# Patient Record
Sex: Female | Born: 1995 | Race: White | Hispanic: No | Marital: Single | Smoking: Never smoker
Health system: Southern US, Community
[De-identification: ages and names within clinical notes are randomized; demographics above are authoritative.]

## PROBLEM LIST (undated history)

## (undated) HISTORY — PX: BREAST REDUCTION SURGERY: SHX8

---

## 2017-09-15 ENCOUNTER — Emergency Department
Admission: EM | Admit: 2017-09-15 | Discharge: 2017-09-15 | Disposition: A | Discharge status: Home / Self Care | Payer: Managed Care, Other (non HMO) | Attending: Emergency Medicine | Admitting: Emergency Medicine

## 2017-09-15 ENCOUNTER — Encounter: Payer: Self-pay | Admitting: Emergency Medicine

## 2017-09-15 DIAGNOSIS — B9689 Other specified bacterial agents as the cause of diseases classified elsewhere: Secondary | ICD-10-CM | POA: Insufficient documentation

## 2017-09-15 DIAGNOSIS — N898 Other specified noninflammatory disorders of vagina: Secondary | ICD-10-CM | POA: Diagnosis present

## 2017-09-15 DIAGNOSIS — N760 Acute vaginitis: Secondary | ICD-10-CM | POA: Diagnosis not present

## 2017-09-15 LAB — WET PREP, GENITAL
Sperm: NONE SEEN
Trich, Wet Prep: NONE SEEN
Yeast Wet Prep HPF POC: NONE SEEN

## 2017-09-15 LAB — URINALYSIS, COMPLETE (UACMP) WITH MICROSCOPIC
BILIRUBIN URINE: NEGATIVE
GLUCOSE, UA: NEGATIVE mg/dL
HGB URINE DIPSTICK: NEGATIVE
KETONES UR: NEGATIVE mg/dL
LEUKOCYTES UA: NEGATIVE
Nitrite: NEGATIVE
PROTEIN: NEGATIVE mg/dL
Specific Gravity, Urine: 1.02 (ref 1.005–1.030)
pH: 6 (ref 5.0–8.0)

## 2017-09-15 LAB — CHLAMYDIA/NGC RT PCR (ARMC ONLY)
Chlamydia Tr: NOT DETECTED
N GONORRHOEAE: NOT DETECTED

## 2017-09-15 MED ORDER — FLUCONAZOLE 50 MG PO TABS
150.0000 mg | ORAL_TABLET | Freq: Once | ORAL | Status: AC
  Administered 2017-09-15: 150 mg via ORAL
  Filled 2017-09-15: qty 1

## 2017-09-15 MED ORDER — METRONIDAZOLE IN NACL 5-0.79 MG/ML-% IV SOLN: 500.0000 mg | Freq: Once | INTRAVENOUS | Status: DC

## 2017-09-15 MED ORDER — METRONIDAZOLE 500 MG PO TABS: 500.0000 mg | ORAL_TABLET | Freq: Two times a day (BID) | ORAL | 0 refills | Status: AC

## 2017-09-15 MED ORDER — METRONIDAZOLE 500 MG PO TABS
500.0000 mg | ORAL_TABLET | Freq: Once | ORAL | Status: AC
  Administered 2017-09-15: 500 mg via ORAL
  Filled 2017-09-15: qty 1

## 2017-09-15 NOTE — ED Notes (Signed)
Vaginal itching and burning x 3 days. States worsening each day also dysuria

## 2017-09-15 NOTE — ED Provider Notes (Signed)
[facility] Emergency Department Provider Note   ____________________________________________   First MD Initiated Contact with Patient 09/15/17 0400     (approximate)  I have reviewed the triage vital signs and the nursing notes.   HISTORY  Chief Complaint Vaginal Itching and Dysuria    HPI Beth Lucas is a 22 y.o. female who comes into the hospital today with a concern about a yeast infection.  The patient reports that she used some Monistat she got from a friend's house and after she inserted the medication she started having some burning with pain with urination.  The patient reports that the symptoms has not gone away.  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  The patient did not go to the doctor and was waiting to see if the medication help.  The patient states that she would not stop the medicine and took it out but she still having some irritation and burning.  She had some white specks on her labia and she did have some white discharge.  The patient is sexually active but reports that she uses condoms every time.  She is here for evaluation.   History reviewed. No pertinent past medical history.  There are no active problems to display for this patient.   History reviewed. No pertinent surgical history.  Prior to Admission medications   Medication Sig Start Date End Date Taking? Authorizing Provider  metroNIDAZOLE (FLAGYL) 500 MG tablet Take 1 tablet (500 mg total) by mouth 2 (two) times daily for 7 days. 09/15/17 09/22/17  Rebecka ApleyWebster, Allison P, MD    Allergies Patient has no known allergies.  History reviewed. No pertinent family history.  Social History Social History   Tobacco Use  . Smoking status: Never Smoker  . Smokeless tobacco: Never Used  Substance Use Topics  . Alcohol use: Not on file  . Drug use: Not on file    Review of Systems  Constitutional: No fever/chills Eyes: No visual changes. ENT: No sore throat. Cardiovascular: Denies chest  pain. Respiratory: Denies shortness of breath. Gastrointestinal: No abdominal pain.  No nausea, no vomiting.  No diarrhea.  No constipation. Genitourinary: Vaginal discharge and dysuria. Musculoskeletal: Negative for back pain. Skin: Negative for rash. Neurological: Negative for headaches, focal weakness or numbness.   ____________________________________________   PHYSICAL EXAM:  VITAL SIGNS: ED Triage Vitals [09/15/17 0051]  Enc Vitals Group     BP (!) 147/76     Pulse Rate 67     Resp 16     Temp 97.9 F (36.6 C)     Temp Source Oral     SpO2 100 %     Weight 125 lb (56.7 kg)     Height      Head Circumference      Peak Flow      Pain Score      Pain Loc      Pain Edu?      Excl. in GC?     Constitutional: Alert and oriented. Well appearing and in mild distress. Eyes: Conjunctivae are normal. PERRL. EOMI. Head: Atraumatic. Nose: No congestion/rhinnorhea. Mouth/Throat: Mucous membranes are moist.  Oropharynx non-erythematous. Cardiovascular: Normal rate, regular rhythm. Grossly normal heart sounds.  Good peripheral circulation. Respiratory: Normal respiratory effort.  No retractions. Lungs CTAB. Gastrointestinal: Soft and nontender. No distention. Positive bowel sounds Genitourinary: normal external genitalia with some white appearing discharge, no cervical motion tenderness and no adnexal tenderness Musculoskeletal: No lower extremity tenderness nor edema.   Neurologic:  Normal speech and language.  Skin:  Skin is warm, dry and intact.  Psychiatric: Mood and affect are normal.   ____________________________________________   LABS (all labs ordered are listed, but only abnormal results are displayed)  Labs Reviewed  WET PREP, GENITAL - Abnormal; Notable for the following components:      Result Value   Clue Cells Wet Prep HPF POC PRESENT (*)    WBC, Wet Prep HPF POC FEW (*)    All other components within normal limits  URINALYSIS, COMPLETE (UACMP) WITH  MICROSCOPIC - Abnormal; Notable for the following components:   Color, Urine YELLOW (*)    APPearance CLOUDY (*)    Bacteria, UA FEW (*)    Squamous Epithelial / LPF 6-30 (*)    All other components within normal limits  CHLAMYDIA/NGC RT PCR ([facility] ONLY)  POC URINE PREG, ED   ____________________________________________  EKG  none ____________________________________________  RADIOLOGY  No results found.  ____________________________________________   PROCEDURES  Procedure(s) performed: None  Procedures  Critical Care performed: No  ____________________________________________   INITIAL IMPRESSION / ASSESSMENT AND PLAN / ED COURSE  As part of my medical decision making, I reviewed the following data within the electronic MEDICAL RECORD NUMBER Notes from prior ED visits and [place] Controlled Substance Database   This is a 22 year old female who comes into the hospital today with some vaginal itching and discharge.  My differential diagnosis includes yeast infection, bacterial vaginosis, STI  The patient had a pelvic exam with a wet prep.  It showed some clue cells.  I gave the patient a dose of Flagyl as well as a dose of Diflucan.  The patient had recently used some Monistat which may skew the results of the wet prep.  The patient will be discharged home to follow-up with OB/GYN.      ____________________________________________   FINAL CLINICAL IMPRESSION(S) / ED DIAGNOSES  Final diagnoses:  Bacterial vaginosis  Acute vaginitis     ED Discharge Orders        Ordered    metroNIDAZOLE (FLAGYL) 500 MG tablet  2 times daily     09/15/17 0452       Note:  This document was prepared using Dragon voice recognition software and may include unintentional dictation errors.    Rebecka Apley, MD 09/15/17 0500

## 2017-09-15 NOTE — Discharge Instructions (Signed)
Please follow-up with OB/GYN for further evaluation of your vaginal irritation and pain.

## 2017-09-15 NOTE — ED Triage Notes (Signed)
Pt c/o dysuria and vaginal itching x3 days.

## 2017-09-16 LAB — URINE CULTURE: CULTURE: NO GROWTH

## 2017-09-16 LAB — POCT PREGNANCY, URINE: PREG TEST UR: NEGATIVE

## 2018-09-28 ENCOUNTER — Emergency Department
Admission: EM | Admit: 2018-09-28 | Discharge: 2018-09-28 | Disposition: A | Discharge status: Home / Self Care | Payer: Managed Care, Other (non HMO) | Attending: Emergency Medicine | Admitting: Emergency Medicine

## 2018-09-28 ENCOUNTER — Encounter: Payer: Self-pay | Admitting: Emergency Medicine

## 2018-09-28 ENCOUNTER — Other Ambulatory Visit: Payer: Self-pay

## 2018-09-28 DIAGNOSIS — J101 Influenza due to other identified influenza virus with other respiratory manifestations: Secondary | ICD-10-CM | POA: Insufficient documentation

## 2018-09-28 DIAGNOSIS — Z79899 Other long term (current) drug therapy: Secondary | ICD-10-CM | POA: Diagnosis not present

## 2018-09-28 DIAGNOSIS — R509 Fever, unspecified: Secondary | ICD-10-CM | POA: Diagnosis present

## 2018-09-28 LAB — INFLUENZA PANEL BY PCR (TYPE A & B)
Influenza A By PCR: NEGATIVE
Influenza B By PCR: POSITIVE — AB

## 2018-09-28 MED ORDER — OSELTAMIVIR PHOSPHATE 75 MG PO CAPS: 75.0000 mg | ORAL_CAPSULE | Freq: Two times a day (BID) | ORAL | 0 refills | Status: DC

## 2018-09-28 MED ORDER — ONDANSETRON 4 MG PO TBDP: 4.0000 mg | ORAL_TABLET | Freq: Three times a day (TID) | ORAL | 0 refills | Status: DC | PRN

## 2018-09-28 MED ORDER — ONDANSETRON 4 MG PO TBDP
4.0000 mg | ORAL_TABLET | Freq: Once | ORAL | Status: AC
  Administered 2018-09-28: 4 mg via ORAL
  Filled 2018-09-28: qty 1

## 2018-09-28 NOTE — Discharge Instructions (Addendum)
Follow-up with the [facility] student health services or the acute care if not better in 3 days.  Return if you feel like you are becoming dehydrated.  Use the Zofran for nausea/vomiting.  Tamiflu to decrease your flulike symptoms.

## 2018-09-28 NOTE — ED Triage Notes (Signed)
Presents with body aches cough and fever    States she had couple episodes of vomiting d/t cough   But this am also had has had some vomiting w/o cough

## 2018-09-28 NOTE — ED Provider Notes (Signed)
[facility] Emergency Department Provider Note  ____________________________________________   First MD Initiated Contact with Patient 09/28/18 [phone]     (approximate)  I have reviewed the triage vital signs and the nursing notes.   HISTORY  Chief Complaint Generalized Body Aches and Emesis    HPI Beth Lucas is a 23 y.o. female flulike symptoms, patient complained of fever, chills, body aches, cough, denies sore throat, 6 episodes of vomiting, denies diarrhea; denies chest pain or sob.  Sx for 2 days.  Patient states that her roommates have the flu.  She does not think she be pregnant as she has IUD.   History reviewed. No pertinent past medical history.  There are no active problems to display for this patient.   History reviewed. No pertinent surgical history.  Prior to Admission medications   Medication Sig Start Date End Date Taking? Authorizing Provider  buPROPion (WELLBUTRIN SR) 150 MG 12 hr tablet Take 150 mg by mouth 2 (two) times daily.   Yes [provider]  ondansetron (ZOFRAN-ODT) 4 MG disintegrating tablet Take 1 tablet (4 mg total) by mouth every 8 (eight) hours as needed. 09/28/18   Avram Danielson, Roselyn Bering, PA-C  oseltamivir (TAMIFLU) 75 MG capsule Take 1 capsule (75 mg total) by mouth 2 (two) times daily. 09/28/18   Faythe Ghee, PA-C    Allergies Patient has no known allergies.  No family history on file.  Social History Social History   Tobacco Use  . Smoking status: Never Smoker  . Smokeless tobacco: Never Used  Substance Use Topics  . Alcohol use: Not on file  . Drug use: Not on file    Review of Systems  Constitutional: Positive fever/chills Eyes: No visual changes. ENT: No sore throat. Respiratory: Positive cough Gastrointestinal: Positive for nausea/vomiting, denies diarrhea Genitourinary: Negative for dysuria. Musculoskeletal: Negative for back pain. Skin: Negative for  rash.    ____________________________________________   PHYSICAL EXAM:  VITAL SIGNS: ED Triage Vitals  Enc Vitals Group     BP 09/28/18 0830 126/65     Pulse Rate 09/28/18 0830 (!) 110     Resp 09/28/18 0830 18     Temp 09/28/18 0830 98.7 F (37.1 C)     Temp Source 09/28/18 0830 Oral     SpO2 09/28/18 0830 95 %     Weight 09/28/18 0824 125 lb (56.7 kg)     Height 09/28/18 0824 5\' 3"  (1.6 m)     Head Circumference --      Peak Flow --      Pain Score 09/28/18 0824 4     Pain Loc --      Pain Edu? --      Excl. in GC? --     Constitutional: Alert and oriented. Well appearing and in no acute distress. Eyes: Conjunctivae are normal.  Head: Atraumatic. Nose: No congestion/rhinnorhea. Mouth/Throat: Mucous membranes are moist.   Neck:  supple no lymphadenopathy noted Cardiovascular: Normal rate, regular rhythm. Heart sounds are normal Respiratory: Normal respiratory effort.  No retractions, lungs c t a  Abd: soft mildly tender in all 4 quads, no McBurney's point tenderness, negative Murphy sign, Bs normal all 4 quad GU: deferred Musculoskeletal: FROM all extremities, warm and well perfused Neurologic:  Normal speech and language.  Skin:  Skin is warm, dry and intact. No rash noted. Psychiatric: Mood and affect are normal. Speech and behavior are normal.  ____________________________________________   LABS (all labs ordered are listed, but only  abnormal results are displayed)  Labs Reviewed  INFLUENZA PANEL BY PCR (TYPE A & B) - Abnormal; Notable for the following components:      Result Value   Influenza B By PCR POSITIVE (*)    All other components within normal limits   ____________________________________________   ____________________________________________  RADIOLOGY    ____________________________________________   PROCEDURES  Procedure(s) performed: No  Procedures    ____________________________________________   INITIAL IMPRESSION /  ASSESSMENT AND PLAN / ED COURSE  Pertinent labs & imaging results that were available during my care of the patient were reviewed by me and considered in my medical decision making (see chart for details).   Patient is 23 year old female presents emergency department complaint of flulike symptoms.  Patient states her roommates have had the flu.  Physical exam patient appears nontoxic.  She does have a dry hacking cough.  Due to her nausea Zofran ODT was given to her. Flu test is positive for influenza B.  Discussed the results with patient.  She is to follow-up with [facility] student health services or return emergency department if worsening.  Take Tylenol/ibuprofen for fever.  Drink plenty of fluids.  She was also given prescription for Tamiflu and Zofran.  She states she understands and will comply.  She is given a school note.  She is discharged in stable condition.     As part of my medical decision making, I reviewed the following data within the electronic MEDICAL RECORD NUMBER Nursing notes reviewed and incorporated, Labs reviewed flu test is positive, Old chart reviewed, Notes from prior ED visits and [place] Controlled Substance Database  ____________________________________________   FINAL CLINICAL IMPRESSION(S) / ED DIAGNOSES  Final diagnoses:  Influenza B      NEW MEDICATIONS STARTED DURING THIS VISIT:  Discharge Medication List as of 09/28/2018  9:42 AM    START taking these medications   Details  ondansetron (ZOFRAN-ODT) 4 MG disintegrating tablet Take 1 tablet (4 mg total) by mouth every 8 (eight) hours as needed., Starting Tue 09/28/2018, Normal    oseltamivir (TAMIFLU) 75 MG capsule Take 1 capsule (75 mg total) by mouth 2 (two) times daily., Starting Tue 09/28/2018, Normal         Note:  This document was prepared using Dragon voice recognition software and may include unintentional dictation errors.    Faythe Ghee, PA-C 09/28/18 1317    Rockne Menghini,  MD 09/28/18 1524

## 2019-05-25 ENCOUNTER — Other Ambulatory Visit: Payer: Self-pay | Admitting: *Deleted

## 2019-05-25 DIAGNOSIS — Z20822 Contact with and (suspected) exposure to covid-19: Secondary | ICD-10-CM

## 2019-05-26 LAB — NOVEL CORONAVIRUS, NAA: SARS-CoV-2, NAA: NOT DETECTED

## 2019-06-19 DIAGNOSIS — F1210 Cannabis abuse, uncomplicated: Secondary | ICD-10-CM | POA: Insufficient documentation

## 2019-06-19 DIAGNOSIS — Z79899 Other long term (current) drug therapy: Secondary | ICD-10-CM | POA: Diagnosis not present

## 2019-06-19 DIAGNOSIS — F419 Anxiety disorder, unspecified: Secondary | ICD-10-CM | POA: Diagnosis present

## 2019-06-19 DIAGNOSIS — R1110 Vomiting, unspecified: Secondary | ICD-10-CM | POA: Diagnosis not present

## 2019-06-19 DIAGNOSIS — F410 Panic disorder [episodic paroxysmal anxiety] without agoraphobia: Secondary | ICD-10-CM | POA: Insufficient documentation

## 2019-06-20 ENCOUNTER — Other Ambulatory Visit: Payer: Self-pay

## 2019-06-20 ENCOUNTER — Emergency Department
Admission: EM | Admit: 2019-06-20 | Discharge: 2019-06-20 | Disposition: A | Discharge status: Home / Self Care | Payer: Managed Care, Other (non HMO) | Attending: Emergency Medicine | Admitting: Emergency Medicine

## 2019-06-20 ENCOUNTER — Encounter: Payer: Self-pay | Admitting: Emergency Medicine

## 2019-06-20 DIAGNOSIS — F419 Anxiety disorder, unspecified: Secondary | ICD-10-CM

## 2019-06-20 DIAGNOSIS — F410 Panic disorder [episodic paroxysmal anxiety] without agoraphobia: Secondary | ICD-10-CM

## 2019-06-20 DIAGNOSIS — Z20822 Contact with and (suspected) exposure to covid-19: Secondary | ICD-10-CM

## 2019-06-20 LAB — URINALYSIS, COMPLETE (UACMP) WITH MICROSCOPIC
Bilirubin Urine: NEGATIVE
Glucose, UA: NEGATIVE mg/dL
Hgb urine dipstick: NEGATIVE
Ketones, ur: 5 mg/dL — AB
Nitrite: NEGATIVE
Protein, ur: 30 mg/dL — AB
Specific Gravity, Urine: 1.026 (ref 1.005–1.030)
pH: 6 (ref 5.0–8.0)

## 2019-06-20 LAB — COMPREHENSIVE METABOLIC PANEL
ALT: 25 U/L (ref 0–44)
AST: 28 U/L (ref 15–41)
Albumin: 5.1 g/dL — ABNORMAL HIGH (ref 3.5–5.0)
Alkaline Phosphatase: 33 U/L — ABNORMAL LOW (ref 38–126)
Anion gap: 10 (ref 5–15)
BUN: 14 mg/dL (ref 6–20)
CO2: 27 mmol/L (ref 22–32)
Calcium: 10 mg/dL (ref 8.9–10.3)
Chloride: 102 mmol/L (ref 98–111)
Creatinine, Ser: 0.79 mg/dL (ref 0.44–1.00)
GFR calc Af Amer: >60 mL/min (ref >60)
GFR calc non Af Amer: >60 mL/min (ref >60)
Glucose, Bld: 112 mg/dL — ABNORMAL HIGH (ref 70–99)
Potassium: 3.8 mmol/L (ref 3.5–5.1)
Sodium: 139 mmol/L (ref 135–145)
Total Bilirubin: 1 mg/dL (ref 0.3–1.2)
Total Protein: 8.5 g/dL — ABNORMAL HIGH (ref 6.5–8.1)

## 2019-06-20 LAB — CBC
HCT: 39.7 % (ref 36.0–46.0)
Hemoglobin: 13.5 g/dL (ref 12.0–15.0)
MCH: 30.1 pg (ref 26.0–34.0)
MCHC: 34 g/dL (ref 30.0–36.0)
MCV: 88.6 fL (ref 80.0–100.0)
Platelets: 239 10*3/uL (ref 150–400)
RBC: 4.48 MIL/uL (ref 3.87–5.11)
RDW: 11.2 % — ABNORMAL LOW (ref 11.5–15.5)
WBC: 10.3 10*3/uL (ref 4.0–10.5)
nRBC: 0 % (ref 0.0–0.2)

## 2019-06-20 LAB — POCT PREGNANCY, URINE: Preg Test, Ur: NEGATIVE

## 2019-06-20 LAB — LIPASE, BLOOD: Lipase: 38 U/L (ref 11–51)

## 2019-06-20 MED ORDER — ONDANSETRON 4 MG PO TBDP
4.0000 mg | ORAL_TABLET | Freq: Once | ORAL | Status: AC
  Administered 2019-06-20: 4 mg via ORAL
  Filled 2019-06-20: qty 1

## 2019-06-20 MED ORDER — ALUM & MAG HYDROXIDE-SIMETH 200-200-20 MG/5ML PO SUSP
30.0000 mL | Freq: Once | ORAL | Status: AC
  Administered 2019-06-20: 30 mL via ORAL
  Filled 2019-06-20: qty 30

## 2019-06-20 MED ORDER — LIDOCAINE VISCOUS HCL 2 % MT SOLN
15.0000 mL | Freq: Once | OROMUCOSAL | Status: AC
  Administered 2019-06-20: 15 mL via ORAL
  Filled 2019-06-20: qty 15

## 2019-06-20 MED ORDER — HYDROXYZINE HCL 25 MG PO TABS
25.0000 mg | ORAL_TABLET | Freq: Once | ORAL | Status: AC
  Administered 2019-06-20: 25 mg via ORAL
  Filled 2019-06-20: qty 1

## 2019-06-20 NOTE — ED Triage Notes (Signed)
Pt arrives POV and ambulatory to triage with c/o anxiety all day. Pt states that she started having episodes of emesis. Pt appears in NAD.

## 2019-06-20 NOTE — ED Notes (Signed)
Pt given ginger ale to drink for PO challenge per MD
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
## 2019-06-21 LAB — NOVEL CORONAVIRUS, NAA: SARS-CoV-2, NAA: NOT DETECTED

## 2019-06-22 ENCOUNTER — Telehealth: Payer: Self-pay

## 2019-06-22 NOTE — Telephone Encounter (Signed)
Patient returned call for [place] lab results - DOB/Address verified - Negative results given, no further questions.

## 2019-12-15 ENCOUNTER — Encounter: Payer: Self-pay | Admitting: Urology

## 2020-11-06 ENCOUNTER — Emergency Department
Admission: EM | Admit: 2020-11-06 | Discharge: 2020-11-06 | Disposition: A | Discharge status: Home / Self Care | Payer: Managed Care, Other (non HMO) | Attending: Emergency Medicine | Admitting: Emergency Medicine

## 2020-11-06 ENCOUNTER — Other Ambulatory Visit: Payer: Self-pay

## 2020-11-06 ENCOUNTER — Ambulatory Visit: Payer: Self-pay

## 2020-11-06 ENCOUNTER — Emergency Department: Payer: Managed Care, Other (non HMO)

## 2020-11-06 ENCOUNTER — Telehealth: Payer: Self-pay

## 2020-11-06 DIAGNOSIS — S060X1A Concussion with loss of consciousness of 30 minutes or less, initial encounter: Secondary | ICD-10-CM | POA: Insufficient documentation

## 2020-11-06 DIAGNOSIS — H538 Other visual disturbances: Secondary | ICD-10-CM | POA: Diagnosis not present

## 2020-11-06 DIAGNOSIS — S0990XA Unspecified injury of head, initial encounter: Secondary | ICD-10-CM | POA: Diagnosis present

## 2020-11-06 DIAGNOSIS — Y9302 Activity, running: Secondary | ICD-10-CM | POA: Diagnosis not present

## 2020-11-06 DIAGNOSIS — W0110XA Fall on same level from slipping, tripping and stumbling with subsequent striking against unspecified object, initial encounter: Secondary | ICD-10-CM | POA: Insufficient documentation

## 2020-11-06 DIAGNOSIS — W19XXXA Unspecified fall, initial encounter: Secondary | ICD-10-CM

## 2020-11-06 MED ORDER — KETOROLAC TROMETHAMINE 30 MG/ML IJ SOLN
30.0000 mg | Freq: Once | INTRAMUSCULAR | Status: AC
  Administered 2020-11-06: 30 mg via INTRAMUSCULAR
  Filled 2020-11-06: qty 1

## 2020-11-06 MED ORDER — MUPIROCIN CALCIUM 2 % EX CREA: 1.0000 "application " | TOPICAL_CREAM | Freq: Two times a day (BID) | CUTANEOUS | 0 refills | Status: AC
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
## 2020-11-06 NOTE — ED Provider Notes (Signed)
[facility]-EMERGENCY DEPARTMENT  ____________________________________________  Time seen: Approximately 3:50 PM  I have reviewed the triage vital signs and the nursing notes.   HISTORY  Chief Complaint Fall   Historian Patient     HPI Beth Lucas is a 25 y.o. female presents to the emergency department after patient had a mechanical fall on Friday.  Patient states that she was running and tripped.  She states that she hit her head, face first.  She states that her vision blacked out for a few seconds but she could hear people talking when injury occurred.  She denies neck pain.  No numbness or tingling in the upper and lower extremities.  She denies chest pain, chest tightness and abdominal pain.  She states that since injury occurred, she has been sleeping most of the time and has blurry vision out of her right eye and dizziness when she stands up.  No prior history of traumatic brain injury.  Patient states that she did not seek care immediately as she thought that she would just ice it and monitor symptoms at home.    No past medical history on file.   Immunizations up to date:  Yes.     No past medical history on file.  There are no problems to display for this patient.   No past surgical history on file.  Prior to Admission medications   Medication Sig Start Date End Date Taking? Authorizing Provider  escitalopram (LEXAPRO) 10 MG tablet Take 10 mg by mouth daily.   Yes [provider]  lisdexamfetamine (VYVANSE) 20 MG capsule Take 20 mg by mouth daily.   Yes [provider]  mupirocin cream (BACTROBAN) 2 % Apply 1 application topically 2 (two) times daily for 7 days. 11/06/20 11/13/20 Yes Orvil Feil, PA-C    Allergies Patient has no known allergies.  No family history on file.  Social History Social History   Tobacco Use  . Smoking status: Never Smoker  . Smokeless tobacco: Never Used  Substance Use Topics  . Alcohol use: Yes  . Drug use:  Yes    Types: Marijuana     Review of Systems  Constitutional: No fever/chills Eyes:  No discharge ENT: Patient has facial pain.  Respiratory: no cough. No SOB/ use of accessory muscles to breath Gastrointestinal:   No nausea, no vomiting.  No diarrhea.  No constipation. Musculoskeletal: Negative for musculoskeletal pain. Skin: Negative for rash, abrasions, lacerations, ecchymosis.   ____________________________________________   PHYSICAL EXAM:  VITAL SIGNS: ED Triage Vitals  Enc Vitals Group     BP 11/06/20 1514 125/78     Pulse Rate 11/06/20 1514 70     Resp 11/06/20 1514 15     Temp 11/06/20 1514 98.3 F (36.8 C)     Temp src --      SpO2 11/06/20 1514 100 %     Weight 11/06/20 1515 145 lb (65.8 kg)     Height 11/06/20 1515 5\' 4"  (1.626 m)     Head Circumference --      Peak Flow --      Pain Score 11/06/20 1514 6     Pain Loc --      Pain Edu? --      Excl. in GC? --      Constitutional: Alert and oriented. Well appearing and in no acute distress. Eyes: Conjunctivae are normal. PERRL. EOMI. Head: Atraumatic. Patient has facial abrasions.  ENT:      Ears: TMs are pearly.  Nose: No congestion/rhinnorhea.      Mouth/Throat: Mucous membranes are moist.  Neck: No stridor.  No cervical spine tenderness to palpation. Cardiovascular: Normal rate, regular rhythm. Normal S1 and S2.  Good peripheral circulation. Respiratory: Normal respiratory effort without tachypnea or retractions. Lungs CTAB. Good air entry to the bases with no decreased or absent breath sounds Gastrointestinal: Bowel sounds x 4 quadrants. Soft and nontender to palpation. No guarding or rigidity. No distention. Musculoskeletal: Full range of motion to all extremities. No obvious deformities noted.  Neurologic:  Normal for age. No gross focal neurologic deficits are appreciated. Cranial nerves 2-12 are intact.  Patient can perform rapid alternating movements. Skin:  Skin is warm, dry and intact.  No rash noted. Psychiatric: Mood and affect are normal for age. Speech and behavior are normal.   ____________________________________________   LABS (all labs ordered are listed, but only abnormal results are displayed)  Labs Reviewed - No data to display ____________________________________________  EKG   ____________________________________________  RADIOLOGY Geraldo Pitter, personally viewed and evaluated these images (plain radiographs) as part of my medical decision making, as well as reviewing the written report by the radiologist.    CT Head Wo Contrast  Result Date: 11/06/2020 CLINICAL DATA:  Facial trauma, fell 5 days ago while running, struck her head, vision 1 black, facial pain, blurred vision mostly on RIGHT little little blood on the LEFT as well EXAM: CT HEAD WITHOUT CONTRAST CT MAXILLOFACIAL WITHOUT CONTRAST TECHNIQUE: Multidetector CT imaging of the head and maxillofacial structures were performed using the standard protocol without intravenous contrast. Multiplanar CT image reconstructions of the maxillofacial structures were also generated. COMPARISON:  None FINDINGS: CT HEAD FINDINGS Brain: Normal ventricular morphology. No midline shift or mass effect. Normal appearance of brain parenchyma. No intracranial hemorrhage, mass lesion, evidence of acute infarction, or extra-axial fluid collection. Vascular: No hyperdense vessels Skull: Intact Other: N/A CT MAXILLOFACIAL FINDINGS Osseous: Incomplete posterior arch C1, developmental anomaly. Nasal septum midline. TMJ alignment normal. Facial bones intact. Orbits: Intraorbital soft tissue planes clear without fluid or gas. Bony orbits intact Sinuses: Mucosal thickening in maxillary sinuses bilaterally. Remaining paranasal sinuses, mastoid air cells and middle ear cavities clear Soft tissues: No significant soft tissue abnormalities. IMPRESSION: Normal CT head. No acute facial bone abnormalities. Nonspecific mucosal thickening  in the maxillary sinuses bilaterally. Electronically Signed   By: Ulyses Southward M.D.   On: 11/06/2020 16:26   CT Maxillofacial Wo Contrast  Result Date: 11/06/2020 CLINICAL DATA:  Facial trauma, fell 5 days ago while running, struck her head, vision 1 black, facial pain, blurred vision mostly on RIGHT little little blood on the LEFT as well EXAM: CT HEAD WITHOUT CONTRAST CT MAXILLOFACIAL WITHOUT CONTRAST TECHNIQUE: Multidetector CT imaging of the head and maxillofacial structures were performed using the standard protocol without intravenous contrast. Multiplanar CT image reconstructions of the maxillofacial structures were also generated. COMPARISON:  None FINDINGS: CT HEAD FINDINGS Brain: Normal ventricular morphology. No midline shift or mass effect. Normal appearance of brain parenchyma. No intracranial hemorrhage, mass lesion, evidence of acute infarction, or extra-axial fluid collection. Vascular: No hyperdense vessels Skull: Intact Other: N/A CT MAXILLOFACIAL FINDINGS Osseous: Incomplete posterior arch C1, developmental anomaly. Nasal septum midline. TMJ alignment normal. Facial bones intact. Orbits: Intraorbital soft tissue planes clear without fluid or gas. Bony orbits intact Sinuses: Mucosal thickening in maxillary sinuses bilaterally. Remaining paranasal sinuses, mastoid air cells and middle ear cavities clear Soft tissues: No significant soft tissue abnormalities. IMPRESSION: Normal CT head.  No acute facial bone abnormalities. Nonspecific mucosal thickening in the maxillary sinuses bilaterally. Electronically Signed   By: Ulyses Southward M.D.   On: 11/06/2020 16:26    ____________________________________________    PROCEDURES  Procedure(s) performed:     Procedures     Medications  ketorolac (TORADOL) 30 MG/ML injection 30 mg (30 mg Intramuscular Given 11/06/20 1640)     ____________________________________________   INITIAL IMPRESSION / ASSESSMENT AND PLAN / ED COURSE  Pertinent  labs & imaging results that were available during my care of the patient were reviewed by me and considered in my medical decision making (see chart for details).       Assessment and Plan:  Fall Concussion 25 year old female presents to the emergency department after she had a mechanical fall while running on Friday.  Vital signs were reassuring at triage.  On physical exam, patient was alert, active and nontoxic-appearing.  She had no neuro deficits noted on exam.  CT head and CT max face showed no evidence of intracranial bleed or maxillofacial fracture.  Patient was given Toradol in the emergency department for headache.  She was discharged with topical mupirocin and advised to continue taking Tylenol and ibuprofen alternating for headache.  Rest and hydration were encouraged at home.  All patient questions were answered.    ____________________________________________  FINAL CLINICAL IMPRESSION(S) / ED DIAGNOSES  Final diagnoses:  Fall, initial encounter  Concussion with loss of consciousness of 30 minutes or less, initial encounter      NEW MEDICATIONS STARTED DURING THIS VISIT:  ED Discharge Orders         Ordered    mupirocin cream (BACTROBAN) 2 %  2 times daily        11/06/20 1637              This chart was dictated using voice recognition software/Dragon. Despite best efforts to proofread, errors can occur which can change the meaning. Any change was purely unintentional.     Orvil Feil, PA-C 11/06/20 1643    Concha Se, MD 11/06/20 [phone]

## 2020-11-06 NOTE — Discharge Instructions (Addendum)
Reduce screen time over the next several days. Apply Mupirocin twice daily to face for the next seven days.  Continue to take Tylenol and Ibuprofen alternating for pain.  You have been given a referral to Neurology. Please use referral if your concussion symptoms do not seem to be improving.

## 2020-11-06 NOTE — ED Notes (Signed)
See triage note  Present s/p fall   States she tripped and fell face first  Abrasion noted to left knee,right hand and right cheek area

## 2020-11-06 NOTE — Telephone Encounter (Signed)
Called pt to inquire about her appt c/c of "head injury". Pt states she tripped and fell over the weekend and struck her "face on the pavement". Pt reports brief LOC at time of fall and that she was evaluated at "[facility]" yesterday and was advised to have additional imaging of face/head for concern of possible orbital fracture/ eye are injury. Explained to pt that we [facility]) does not have capability for extensive facial imaging (Panorex or CT of head) and advised pt to go to ED ASAP. Pt verbalized understanding.

## 2021-05-23 ENCOUNTER — Other Ambulatory Visit: Payer: Self-pay

## 2021-05-23 ENCOUNTER — Ambulatory Visit: Payer: Self-pay

## 2021-05-23 DIAGNOSIS — R300 Dysuria: Secondary | ICD-10-CM | POA: Insufficient documentation

## 2021-05-23 DIAGNOSIS — M545 Low back pain, unspecified: Secondary | ICD-10-CM | POA: Diagnosis not present

## 2021-05-23 DIAGNOSIS — N83291 Other ovarian cyst, right side: Secondary | ICD-10-CM | POA: Insufficient documentation

## 2021-05-23 DIAGNOSIS — R197 Diarrhea, unspecified: Secondary | ICD-10-CM | POA: Diagnosis not present

## 2021-05-23 DIAGNOSIS — N83292 Other ovarian cyst, left side: Secondary | ICD-10-CM | POA: Insufficient documentation

## 2021-05-23 DIAGNOSIS — Z5321 Procedure and treatment not carried out due to patient leaving prior to being seen by health care provider: Secondary | ICD-10-CM | POA: Insufficient documentation

## 2021-05-23 DIAGNOSIS — R1030 Lower abdominal pain, unspecified: Secondary | ICD-10-CM | POA: Diagnosis present

## 2021-05-23 LAB — URINALYSIS, COMPLETE (UACMP) WITH MICROSCOPIC
Bilirubin Urine: NEGATIVE
Glucose, UA: NEGATIVE mg/dL
Hgb urine dipstick: NEGATIVE
Ketones, ur: NEGATIVE mg/dL
Leukocytes,Ua: NEGATIVE
Nitrite: NEGATIVE
Protein, ur: NEGATIVE mg/dL
Specific Gravity, Urine: 1.005 (ref 1.005–1.030)
pH: 6 (ref 5.0–8.0)

## 2021-05-23 LAB — CBC
HCT: 35.8 % — ABNORMAL LOW (ref 36.0–46.0)
Hemoglobin: 12.5 g/dL (ref 12.0–15.0)
MCH: 30.6 pg (ref 26.0–34.0)
MCHC: 34.9 g/dL (ref 30.0–36.0)
MCV: 87.7 fL (ref 80.0–100.0)
Platelets: 199 10*3/uL (ref 150–400)
RBC: 4.08 MIL/uL (ref 3.87–5.11)
RDW: 11.6 % (ref 11.5–15.5)
WBC: 13.7 10*3/uL — ABNORMAL HIGH (ref 4.0–10.5)
nRBC: 0 % (ref 0.0–0.2)

## 2021-05-23 LAB — COMPREHENSIVE METABOLIC PANEL
ALT: 26 U/L (ref 0–44)
AST: 25 U/L (ref 15–41)
Albumin: 4.8 g/dL (ref 3.5–5.0)
Alkaline Phosphatase: 26 U/L — ABNORMAL LOW (ref 38–126)
Anion gap: 8 (ref 5–15)
BUN: 10 mg/dL (ref 6–20)
CO2: 26 mmol/L (ref 22–32)
Calcium: 9.2 mg/dL (ref 8.9–10.3)
Chloride: 100 mmol/L (ref 98–111)
Creatinine, Ser: 0.63 mg/dL (ref 0.44–1.00)
GFR, Estimated: >60 mL/min (ref >60)
Glucose, Bld: 96 mg/dL (ref 70–99)
Potassium: 3.6 mmol/L (ref 3.5–5.1)
Sodium: 134 mmol/L — ABNORMAL LOW (ref 135–145)
Total Bilirubin: 0.7 mg/dL (ref 0.3–1.2)
Total Protein: 7.6 g/dL (ref 6.5–8.1)

## 2021-05-23 LAB — POC URINE PREG, ED: Preg Test, Ur: NEGATIVE

## 2021-05-23 NOTE — ED Triage Notes (Signed)
Pt in with co mid lower abd pain that radiates to lower back. States has had diarrhea today, no vomiting, does have dysuria.

## 2021-05-24 ENCOUNTER — Encounter: Payer: Self-pay | Admitting: Emergency Medicine

## 2021-05-24 ENCOUNTER — Emergency Department
Admission: EM | Admit: 2021-05-24 | Discharge: 2021-05-24 | Disposition: A | Discharge status: Home / Self Care | Payer: Managed Care, Other (non HMO) | Source: Home / Self Care | Attending: Emergency Medicine | Admitting: Emergency Medicine

## 2021-05-24 ENCOUNTER — Emergency Department: Payer: Managed Care, Other (non HMO)

## 2021-05-24 ENCOUNTER — Emergency Department
Admission: EM | Admit: 2021-05-24 | Discharge: 2021-05-24 | Disposition: A | Discharge status: Home / Self Care | Payer: Managed Care, Other (non HMO) | Attending: Emergency Medicine | Admitting: Emergency Medicine

## 2021-05-24 DIAGNOSIS — N83291 Other ovarian cyst, right side: Secondary | ICD-10-CM | POA: Insufficient documentation

## 2021-05-24 DIAGNOSIS — N83292 Other ovarian cyst, left side: Secondary | ICD-10-CM | POA: Insufficient documentation

## 2021-05-24 DIAGNOSIS — N83209 Unspecified ovarian cyst, unspecified side: Secondary | ICD-10-CM

## 2021-05-24 DIAGNOSIS — R102 Pelvic and perineal pain: Secondary | ICD-10-CM

## 2021-05-24 LAB — COMPREHENSIVE METABOLIC PANEL
ALT: 24 U/L (ref 0–44)
AST: 23 U/L (ref 15–41)
Albumin: 4.5 g/dL (ref 3.5–5.0)
Alkaline Phosphatase: 26 U/L — ABNORMAL LOW (ref 38–126)
Anion gap: 7 (ref 5–15)
BUN: 12 mg/dL (ref 6–20)
CO2: 26 mmol/L (ref 22–32)
Calcium: 9.2 mg/dL (ref 8.9–10.3)
Chloride: 102 mmol/L (ref 98–111)
Creatinine, Ser: 0.6 mg/dL (ref 0.44–1.00)
GFR, Estimated: >60 mL/min (ref >60)
Glucose, Bld: 99 mg/dL (ref 70–99)
Potassium: 4 mmol/L (ref 3.5–5.1)
Sodium: 135 mmol/L (ref 135–145)
Total Bilirubin: 1 mg/dL (ref 0.3–1.2)
Total Protein: 7.3 g/dL (ref 6.5–8.1)

## 2021-05-24 LAB — URINALYSIS, COMPLETE (UACMP) WITH MICROSCOPIC
Bilirubin Urine: NEGATIVE
Glucose, UA: NEGATIVE mg/dL
Hgb urine dipstick: NEGATIVE
Ketones, ur: 20 mg/dL — AB
Leukocytes,Ua: NEGATIVE
Nitrite: NEGATIVE
Protein, ur: NEGATIVE mg/dL
Specific Gravity, Urine: 1.013 (ref 1.005–1.030)
pH: 6 (ref 5.0–8.0)

## 2021-05-24 LAB — CBC
HCT: 36.7 % (ref 36.0–46.0)
Hemoglobin: 12.6 g/dL (ref 12.0–15.0)
MCH: 30.4 pg (ref 26.0–34.0)
MCHC: 34.3 g/dL (ref 30.0–36.0)
MCV: 88.6 fL (ref 80.0–100.0)
Platelets: 208 10*3/uL (ref 150–400)
RBC: 4.14 MIL/uL (ref 3.87–5.11)
RDW: 11.6 % (ref 11.5–15.5)
WBC: 7.6 10*3/uL (ref 4.0–10.5)
nRBC: 0 % (ref 0.0–0.2)

## 2021-05-24 LAB — LIPASE, BLOOD: Lipase: 23 U/L (ref 11–51)

## 2021-05-24 LAB — HEMOGLOBIN AND HEMATOCRIT, BLOOD
HCT: 33.2 % — ABNORMAL LOW (ref 36.0–46.0)
Hemoglobin: 11.4 g/dL — ABNORMAL LOW (ref 12.0–15.0)

## 2021-05-24 LAB — POC URINE PREG, ED: Preg Test, Ur: NEGATIVE

## 2021-05-24 MED ORDER — ONDANSETRON HCL 4 MG/2ML IJ SOLN
4.0000 mg | Freq: Once | INTRAMUSCULAR | Status: AC
  Administered 2021-05-24: 4 mg via INTRAVENOUS
  Filled 2021-05-24: qty 2

## 2021-05-24 MED ORDER — IBUPROFEN 600 MG PO TABS: 600.0000 mg | ORAL_TABLET | Freq: Three times a day (TID) | ORAL | 0 refills | Status: DC | PRN

## 2021-05-24 MED ORDER — IOHEXOL 350 MG/ML SOLN
80.0000 mL | Freq: Once | INTRAVENOUS | Status: AC | PRN
  Administered 2021-05-24: 80 mL via INTRAVENOUS
  Filled 2021-05-24: qty 80

## 2021-05-24 MED ORDER — KETOROLAC TROMETHAMINE 30 MG/ML IJ SOLN
15.0000 mg | Freq: Once | INTRAMUSCULAR | Status: AC
  Administered 2021-05-24: 15 mg via INTRAVENOUS
  Filled 2021-05-24: qty 1

## 2021-05-24 MED ORDER — ONDANSETRON 4 MG PO TBDP: 4.0000 mg | ORAL_TABLET | Freq: Three times a day (TID) | ORAL | 0 refills | Status: AC | PRN

## 2021-05-24 MED ORDER — OXYCODONE-ACETAMINOPHEN 5-325 MG PO TABS
2.0000 | ORAL_TABLET | Freq: Once | ORAL | Status: AC
  Administered 2021-05-24: 2 via ORAL
  Filled 2021-05-24: qty 2

## 2021-05-24 MED ORDER — MORPHINE SULFATE (PF) 4 MG/ML IV SOLN
4.0000 mg | Freq: Once | INTRAVENOUS | Status: AC
  Administered 2021-05-24: 4 mg via INTRAVENOUS
  Filled 2021-05-24: qty 1

## 2021-05-24 MED ORDER — ONDANSETRON 4 MG PO TBDP: 4.0000 mg | ORAL_TABLET | Freq: Three times a day (TID) | ORAL | 0 refills | Status: DC | PRN

## 2021-05-24 MED ORDER — OXYCODONE HCL 5 MG PO TABS: 5.0000 mg | ORAL_TABLET | Freq: Four times a day (QID) | ORAL | 0 refills | Status: AC | PRN

## 2021-05-24 MED ORDER — HYDROCODONE-ACETAMINOPHEN 5-325 MG PO TABS: 1.0000 | ORAL_TABLET | Freq: Four times a day (QID) | ORAL | 0 refills | Status: DC | PRN

## 2021-05-24 MED ORDER — SODIUM CHLORIDE 0.9 % IV BOLUS
1000.0000 mL | Freq: Once | INTRAVENOUS | Status: AC
  Administered 2021-05-24: 1000 mL via INTRAVENOUS

## 2021-05-24 NOTE — ED Provider Notes (Addendum)
[facility] Emergency Department Provider Note  ____________________________________________   Event Date/Time   First MD Initiated Contact with Patient 05/24/21 1222     (approximate)  I have reviewed the triage vital signs and the nursing notes.   HISTORY  Chief Complaint Abdominal Pain    HPI Beth Lucas is a 25 y.o. female here with lower abdominal pain.  The patient states that her pain began fairly acutely yesterday with initially mild and progressively worsening aching, stabbing, lower abdominal pain.  The pain is localized primarily in the lower abdomen, but more so on the right lower abdomen.  The pain initially was associated with the sensation like she had to have a bowel movement, and she had loose bowel movement followed by persistent urge to have a bowel movement.  She also states that she has had some aching, cramping, deep pain after urinating.  She denies overt dysuria during the actual urination as well.  She has a history of UTIs but states this feels somewhat different.  The pain is worse with standing straight up.  No alleviating factors.  No fevers.  She said some chills.  She said nausea and decreased appetite.  No actual vomiting.  Denies any vaginal bleeding or discharge.  No other sexual activity.  No dyspareunia.    History reviewed. No pertinent past medical history.  There are no problems to display for this patient.   History reviewed. No pertinent surgical history.  Prior to Admission medications   Medication Sig Start Date End Date Taking? Authorizing Provider  ondansetron (ZOFRAN ODT) 4 MG disintegrating tablet Take 1 tablet (4 mg total) by mouth every 8 (eight) hours as needed for nausea or vomiting. 05/24/21  Yes Shaune Pollack, MD  oxyCODONE (ROXICODONE) 5 MG immediate release tablet Take 1-2 tablets (5-10 mg total) by mouth every 6 (six) hours as needed for moderate pain or severe pain. 05/24/21 05/24/22 Yes Shaune Pollack, MD  escitalopram (LEXAPRO) 10 MG tablet Take 10 mg by mouth daily.    [provider]  lisdexamfetamine (VYVANSE) 20 MG capsule Take 20 mg by mouth daily.    [provider]    Allergies Patient has no known allergies.  No family history on file.  Social History Social History   Tobacco Use   Smoking status: Never   Smokeless tobacco: Never  Substance Use Topics   Alcohol use: Yes   Drug use: Yes    Types: Marijuana    Review of Systems  Review of Systems  Constitutional:  Positive for fatigue. Negative for fever.  HENT:  Negative for congestion and sore throat.   Eyes:  Negative for visual disturbance.  Respiratory:  Negative for cough and shortness of breath.   Cardiovascular:  Negative for chest pain.  Gastrointestinal:  Positive for abdominal pain and nausea. Negative for diarrhea and vomiting.  Genitourinary:  Negative for flank pain.  Musculoskeletal:  Negative for back pain and neck pain.  Skin:  Negative for rash and wound.  Neurological:  Negative for weakness.  All other systems reviewed and are negative.   ____________________________________________  PHYSICAL EXAM:      VITAL SIGNS: ED Triage Vitals  Enc Vitals Group     BP 05/24/21 1201 129/84     Pulse Rate 05/24/21 1201 92     Resp 05/24/21 1201 16     Temp 05/24/21 1201 98.1 F (36.7 C)     Temp Source 05/24/21 1201 Oral  SpO2 05/24/21 1201 98 %     Weight 05/24/21 1156 135 lb (61.2 kg)     Height 05/24/21 1156 5\' 4"  (1.626 m)     Head Circumference --      Peak Flow --      Pain Score 05/24/21 1156 6     Pain Loc --      Pain Edu? --      Excl. in GC? --      Physical Exam Vitals and nursing note reviewed.  Constitutional:      General: She is not in acute distress.    Appearance: She is well-developed.  HENT:     Head: Normocephalic and atraumatic.  Eyes:     Conjunctiva/sclera: Conjunctivae normal.  Cardiovascular:     Rate and Rhythm: Normal rate  and regular rhythm.     Heart sounds: Normal heart sounds. No murmur heard.   No friction rub.  Pulmonary:     Effort: Pulmonary effort is normal. No respiratory distress.     Breath sounds: Normal breath sounds. No wheezing or rales.  Abdominal:     General: There is no distension.     Palpations: Abdomen is soft.     Tenderness: There is abdominal tenderness in the right lower quadrant and suprapubic area. There is guarding. There is no right CVA tenderness, left CVA tenderness or rebound.  Musculoskeletal:     Cervical back: Neck supple.  Skin:    General: Skin is warm.     Capillary Refill: Capillary refill takes less than 2 seconds.  Neurological:     Mental Status: She is alert and oriented to person, place, and time.     Motor: No abnormal muscle tone.      ____________________________________________   LABS (all labs ordered are listed, but only abnormal results are displayed)  Labs Reviewed  COMPREHENSIVE METABOLIC PANEL - Abnormal; Notable for the following components:      Result Value   Alkaline Phosphatase 26 (*)    All other components within normal limits  URINALYSIS, COMPLETE (UACMP) WITH MICROSCOPIC - Abnormal; Notable for the following components:   Color, Urine YELLOW (*)    APPearance HAZY (*)    Ketones, ur 20 (*)    Bacteria, UA MANY (*)    All other components within normal limits  HEMOGLOBIN AND HEMATOCRIT, BLOOD - Abnormal; Notable for the following components:   Hemoglobin 11.4 (*)    HCT 33.2 (*)    All other components within normal limits  LIPASE, BLOOD  CBC  POC URINE PREG, ED    ____________________________________________  EKG:  ________________________________________  RADIOLOGY All imaging, including plain films, CT scans, and ultrasounds, independently reviewed by me, and interpretations confirmed via formal radiology reads.  ED MD interpretation:   CT A/P: Moderate amount of high density fluid in the pelvis and cul-de-sac  likely consistent with bleeding from ovarian cyst.  Official radiology report(s): CT ABDOMEN PELVIS W CONTRAST  Result Date: 05/24/2021 CLINICAL DATA:  Right lower quadrant abdominal pain. EXAM: CT ABDOMEN AND PELVIS WITH CONTRAST TECHNIQUE: Multidetector CT imaging of the abdomen and pelvis was performed using the standard protocol following bolus administration of intravenous contrast. CONTRAST:  43mL OMNIPAQUE IOHEXOL 350 MG/ML SOLN COMPARISON:  None. FINDINGS: Lower chest: No acute abnormality. Hepatobiliary: No focal liver abnormality is seen. No gallstones, gallbladder wall thickening, or biliary dilatation. Pancreas: Unremarkable. No pancreatic ductal dilatation or surrounding inflammatory changes. Spleen: Normal in size without focal abnormality. Adrenals/Urinary Tract: Adrenal  glands are unremarkable. Kidneys are normal, without renal calculi, focal lesion, or hydronephrosis. Bladder is unremarkable. Stomach/Bowel: Stomach is within normal limits. Appendix appears normal. No evidence of bowel wall thickening, distention, or inflammatory changes. Vascular/Lymphatic: No significant vascular findings are present. No enlarged abdominal or pelvic lymph nodes. Reproductive: Intrauterine device is noted. 3.6 cm right ovarian follicle is noted. There is a moderate amount of fluid seen in the pelvis both in the cul-de-sac but predominantly in the right adnexal region. There is some high density material within this fluid concerning for hemorrhage, and this is concerning for ruptured ovarian cyst. Other: No hernia is noted. Musculoskeletal: No acute or significant osseous findings. IMPRESSION: Moderate amount of high density fluid is noted in the pelvis included in the cul-de-sac, but predominantly in the right adnexal region. This is concerning for hemorrhage related to ruptured ovarian cyst. Pelvic ultrasound may be performed for further evaluation. Critical Value/emergent results were called by telephone at  the time of interpretation on 05/24/2021 at 1:39 pm to provider Shaune Pollack , who verbally acknowledged these results. Electronically Signed   By: Lupita Raider M.D.   On: 05/24/2021 13:39   US PELVIC COMPLETE W TRANSVAGINAL AND TORSION R/O  Result Date: 05/24/2021 CLINICAL DATA:  Adnexal pain.  Hemoperitoneum on CT EXAM: TRANSABDOMINAL AND TRANSVAGINAL ULTRASOUND OF PELVIS DOPPLER ULTRASOUND OF OVARIES TECHNIQUE: Both transabdominal and transvaginal ultrasound examinations of the pelvis were performed. Transabdominal technique was performed for global imaging of the pelvis including uterus, ovaries, adnexal regions, and pelvic cul-de-sac. It was necessary to proceed with endovaginal exam following the transabdominal exam to visualize the bilateral ovaries and adnexa. Color and duplex Doppler ultrasound was utilized to evaluate blood flow to the ovaries. COMPARISON:  Abdomen pelvis 05/24/2021 FINDINGS: Uterus Measurements: 7.7 x 3.1 x 4.3 cm = volume: 53 mL. No fibroids or other mass visualized. Endometrium Thickness: 2 mm. No focal abnormality visualized. T-shaped IUD appears within the endometrial canal in appropriate position. Right ovary Measurements: 4.3 x 3.5 x 3.2 cm = volume: 2.6 mL. There is a 3.4 cm simple right ovarian cyst. Echogenic lesion along the right ovary in the right adnexa likely represents a blood clot. Normal appearance/no adnexal mass. Left ovary Measurements: 2.3 x 1.8 x 1.6 cm = volume: 3.3 mL. Normal appearance/no adnexal mass. Pulsed Doppler evaluation of both ovaries demonstrates normal low-resistance arterial and venous waveforms. Other findings Moderate volume complex free fluid within the pelvis. IMPRESSION: 1. Moderate volume complex free fluid within the pelvis consistent with known hemoperitoneum. Echogenic lesion along the right ovary in the right adnexa likely represents blood products. In the setting of a negative pregnancy test, findings suggestive of a ruptured ovarian  cyst. In the setting of a positive pregnancy test, findings may represent a ruptured ovarian cyst. Recommend clinical correlation. 2. T-shaped IUD in appropriate position. Electronically Signed   By: Tish Frederickson M.D.   On: 05/24/2021 16:01    ____________________________________________  PROCEDURES   Procedure(s) performed (including Critical Care):  Procedures  ____________________________________________  INITIAL IMPRESSION / MDM / ASSESSMENT AND PLAN / ED COURSE  As part of my medical decision making, I reviewed the following data within the electronic MEDICAL RECORD NUMBER Nursing notes reviewed and incorporated, Old chart reviewed, Notes from prior ED visits, and [place] Controlled Substance Database       *Johnnay Pleitez was evaluated in Emergency Department on 05/24/2021 for the symptoms described in the history of present illness. She was evaluated in the context of the  global COVID-19 pandemic, which necessitated consideration that the patient might be at risk for infection with the SARS-CoV-2 virus that causes COVID-19. Institutional protocols and algorithms that pertain to the evaluation of patients at risk for COVID-19 are in a state of rapid change based on information released by regulatory bodies including the CDC and federal and state organizations. These policies and algorithms were followed during the patient's care in the ED.  Some ED evaluations and interventions may be delayed as a result of limited staffing during the pandemic.*     Medical Decision Making: 25 year old female here with lower abdominal pain.  On exam, patient has significant lower and adnexal tenderness.  UA shows no signs of UTI.  CBC without anemia or leukocytosis.  CMP unremarkable.  Urine pregnancy negative.  CT abdomen and pelvis obtained, reviewed, shows no evidence of appendicitis or other GI etiology.  The patient does have a significant amount of fluid in the pelvis concerning for possible hemorrhagic  ruptured cyst.  Patient remains hemodynamically stable.  She does have ongoing pain.  Suspect this is etiology for her symptoms, will give Toradol in addition to analgesia.  Will obtain ultrasound to evaluate for torsion.  Suspect patient can likely managed as an outpatient.  She is adamant she is had no vaginal discharge, dyspareunia, or symptoms to suggest TOA or PID.  Pelvic u/s shows hemorrhagic cyst with small amount of layering blood products. D/w Dr. Feliberto Gottron who recommended repeat h/h, d/c if largely unchanged. Repeat h/h unremarkable. Pt remains HDS. Pain controlled. Will d/c with outpt follow-up, analgesia.  ____________________________________________  FINAL CLINICAL IMPRESSION(S) / ED DIAGNOSES  Final diagnoses:  Adnexal pain  Hemorrhagic ovarian cyst     MEDICATIONS GIVEN DURING THIS VISIT:  Medications  ketorolac (TORADOL) 30 MG/ML injection 15 mg (15 mg Intravenous Given 05/24/21 1255)  ondansetron (ZOFRAN) injection 4 mg (4 mg Intravenous Given 05/24/21 1255)  sodium chloride 0.9 % bolus 1,000 mL (0 mLs Intravenous Stopped 05/24/21 1627)  iohexol (OMNIPAQUE) 350 MG/ML injection 80 mL (80 mLs Intravenous Contrast Given 05/24/21 1302)  morphine 4 MG/ML injection 4 mg (4 mg Intravenous Given 05/24/21 1354)  ketorolac (TORADOL) 30 MG/ML injection 15 mg (15 mg Intravenous Given 05/24/21 1356)  oxyCODONE-acetaminophen (PERCOCET/ROXICET) 5-325 MG per tablet 2 tablet (2 tablets Oral Given 05/24/21 1627)     ED Discharge Orders          Ordered    ibuprofen (ADVIL) 600 MG tablet  Every 8 hours PRN,   Status:  Discontinued        05/24/21 1402    ondansetron (ZOFRAN ODT) 4 MG disintegrating tablet  Every 8 hours PRN,   Status:  Discontinued        05/24/21 1402    HYDROcodone-acetaminophen (NORCO/VICODIN) 5-325 MG tablet  Every 6 hours PRN,   Status:  Discontinued        05/24/21 1402    ondansetron (ZOFRAN ODT) 4 MG disintegrating tablet  Every 8 hours PRN        05/24/21  1908    oxyCODONE (ROXICODONE) 5 MG immediate release tablet  Every 6 hours PRN        05/24/21 1908             Note:  This document was prepared using Dragon voice recognition software and may include unintentional dictation errors.   Shaune Pollack, MD 05/24/21 1402    Shaune Pollack, MD 05/24/21 2136

## 2021-05-24 NOTE — ED Notes (Signed)
No answer when called several times from lobby 

## 2021-05-24 NOTE — Discharge Instructions (Signed)
Avoid ibuprofen, alleve, or other NSAID medications    Take the Oxycodone 5-10 mg for severe pain (1-2 tablets). Do NOT take more than 6 tabs daily.  Take Tylenol 1000 mg every 6 hours for mild to moderate pain  Take Zofran for nausea  Follow-up with OB as mentioned

## 2021-05-24 NOTE — Consult Note (Signed)
Consult History and Physical   SERVICE: Gynecology _ [facility] , unassigned patient   Patient Name: Beth Lucas Patient MRN:   [id number]  CC: one day h/o of acute onset lower abdominal pain .Marland KitchenLmp beginning Aug . IuD in place . Sexually active . Menses irregular with Kylena  Pt underwent an u/s that shows probable Hemorrhagic corpus luteal cyst   HPI: Beth Lucas is a 25 y.o. No obstetric history on file. with    Review of Systems: positives in bold GEN:   fevers, chills, weight changes, appetite changes, fatigue, night sweats HEENT:  HA, vision changes, hearing loss, congestion, rhinorrhea, sinus pressure, dysphagia CV:   CP, palpitations PULM:  SOB, cough GI:  +abd pain, N/V/D/C GU:  dysuria, urgency, frequency MSK:  arthralgias, myalgias, back pain, swelling SKIN:  rashes, color changes, pallor NEURO:  numbness, weakness, tingling, seizures, dizziness, tremors PSYCH:  depression, anxiety, behavioral problems, confusion  HEME/LYMPH:  easy bruising or bleeding ENDO:  heat/cold intolerance  Past Obstetrical History: OB History   No obstetric history on file.     Past Gynecologic History:  Past Medical History: History reviewed. No pertinent past medical history.  Past Surgical History:  History reviewed. No pertinent surgical history.  Family History:  family history is not on file.  Social History:  Social History   Socioeconomic History   Marital status: Single    Spouse name: Not on file   Number of children: Not on file   Years of education: Not on file   Highest education level: Not on file  Occupational History   Not on file  Tobacco Use   Smoking status: Never   Smokeless tobacco: Never  Substance and Sexual Activity   Alcohol use: Yes   Drug use: Yes    Types: Marijuana   Sexual activity: Not on file  Other Topics Concern   Not on file  Social History Narrative   Not on file   Social Determinants of Health   Financial Resource  Strain: Not on file  Food Insecurity: Not on file  Transportation Needs: Not on file  Physical Activity: Not on file  Stress: Not on file  Social Connections: Not on file  Intimate Partner Violence: Not on file    Home Medications:  Medications reconciled in EPIC  No current facility-administered medications on file prior to encounter.   Current Outpatient Medications on File Prior to Encounter  Medication Sig Dispense Refill   escitalopram (LEXAPRO) 10 MG tablet Take 10 mg by mouth daily.     lisdexamfetamine (VYVANSE) 20 MG capsule Take 20 mg by mouth daily.      Allergies:  No Known Allergies  Physical Exam:  Temp:  [98.1 F (36.7 C)-98.7 F (37.1 C)] 98.1 F (36.7 C) (09/16 1201) Pulse Rate:  [73-109] 73 (09/16 1400) Resp:  [16-20] 16 (09/16 1357) BP: (115-138)/(84-97) 138/87 (09/16 1400) SpO2:  [97 %-100 %] 97 % (09/16 1400) Weight:  [61.2 kg] 61.2 kg (09/16 1156)   General Appearance:  Well developed, well nourished, no acute distress, alert and oriented x3 HEENT:  Normocephalic atraumatic, extraocular movements intact, moist mucous membranes Cardiovascular:  Normal S1/S2, regular rate and rhythm, no murmurs Pulmonary:  clear to auscultation, no wheezes, rales or rhonchi, symmetric air entry, good air exchange Abdomen:  Bowel sounds present, soft, nontender, nondistended, no abnormal masses, no epigastric pain Psychiatric:  Normal mood and affect, appropriate, no AH/VH Pelvic:  deferred   Labs/Studies:   CBC and Coags:  Lab  Results  Component Value Date   WBC 7.6 05/24/2021   HGB 12.6 05/24/2021   HCT 36.7 05/24/2021   MCV 88.6 05/24/2021   PLT 208 05/24/2021   CMP:  Lab Results  Component Value Date   NA 135 05/24/2021   K 4.0 05/24/2021   CL 102 05/24/2021   CO2 26 05/24/2021   BUN 12 05/24/2021   CREATININE 0.60 05/24/2021   CREATININE 0.63 05/23/2021   CREATININE 0.79 06/20/2019   PROT 7.3 05/24/2021   BILITOT 1.0 05/24/2021   ALT 24  05/24/2021   AST 23 05/24/2021   ALKPHOS 26 (L) 05/24/2021    Other Imaging: CT ABDOMEN PELVIS W CONTRAST  Result Date: 05/24/2021 CLINICAL DATA:  Right lower quadrant abdominal pain. EXAM: CT ABDOMEN AND PELVIS WITH CONTRAST TECHNIQUE: Multidetector CT imaging of the abdomen and pelvis was performed using the standard protocol following bolus administration of intravenous contrast. CONTRAST:  75mL OMNIPAQUE IOHEXOL 350 MG/ML SOLN COMPARISON:  None. FINDINGS: Lower chest: No acute abnormality. Hepatobiliary: No focal liver abnormality is seen. No gallstones, gallbladder wall thickening, or biliary dilatation. Pancreas: Unremarkable. No pancreatic ductal dilatation or surrounding inflammatory changes. Spleen: Normal in size without focal abnormality. Adrenals/Urinary Tract: Adrenal glands are unremarkable. Kidneys are normal, without renal calculi, focal lesion, or hydronephrosis. Bladder is unremarkable. Stomach/Bowel: Stomach is within normal limits. Appendix appears normal. No evidence of bowel wall thickening, distention, or inflammatory changes. Vascular/Lymphatic: No significant vascular findings are present. No enlarged abdominal or pelvic lymph nodes. Reproductive: Intrauterine device is noted. 3.6 cm right ovarian follicle is noted. There is a moderate amount of fluid seen in the pelvis both in the cul-de-sac but predominantly in the right adnexal region. There is some high density material within this fluid concerning for hemorrhage, and this is concerning for ruptured ovarian cyst. Other: No hernia is noted. Musculoskeletal: No acute or significant osseous findings. IMPRESSION: Moderate amount of high density fluid is noted in the pelvis included in the cul-de-sac, but predominantly in the right adnexal region. This is concerning for hemorrhage related to ruptured ovarian cyst. Pelvic ultrasound may be performed for further evaluation. Critical Value/emergent results were called by telephone at the  time of interpretation on 05/24/2021 at 1:39 pm to provider Shaune Pollack , who verbally acknowledged these results. Electronically Signed   By: Lupita Raider M.D.   On: 05/24/2021 13:39   US PELVIC COMPLETE W TRANSVAGINAL AND TORSION R/O  Result Date: 05/24/2021 CLINICAL DATA:  Adnexal pain.  Hemoperitoneum on CT EXAM: TRANSABDOMINAL AND TRANSVAGINAL ULTRASOUND OF PELVIS DOPPLER ULTRASOUND OF OVARIES TECHNIQUE: Both transabdominal and transvaginal ultrasound examinations of the pelvis were performed. Transabdominal technique was performed for global imaging of the pelvis including uterus, ovaries, adnexal regions, and pelvic cul-de-sac. It was necessary to proceed with endovaginal exam following the transabdominal exam to visualize the bilateral ovaries and adnexa. Color and duplex Doppler ultrasound was utilized to evaluate blood flow to the ovaries. COMPARISON:  Abdomen pelvis 05/24/2021 FINDINGS: Uterus Measurements: 7.7 x 3.1 x 4.3 cm = volume: 53 mL. No fibroids or other mass visualized. Endometrium Thickness: 2 mm. No focal abnormality visualized. T-shaped IUD appears within the endometrial canal in appropriate position. Right ovary Measurements: 4.3 x 3.5 x 3.2 cm = volume: 2.6 mL. There is a 3.4 cm simple right ovarian cyst. Echogenic lesion along the right ovary in the right adnexa likely represents a blood clot. Normal appearance/no adnexal mass. Left ovary Measurements: 2.3 x 1.8 x 1.6 cm = volume: 3.3  mL. Normal appearance/no adnexal mass. Pulsed Doppler evaluation of both ovaries demonstrates normal low-resistance arterial and venous waveforms. Other findings Moderate volume complex free fluid within the pelvis. IMPRESSION: 1. Moderate volume complex free fluid within the pelvis consistent with known hemoperitoneum. Echogenic lesion along the right ovary in the right adnexa likely represents blood products. In the setting of a negative pregnancy test, findings suggestive of a ruptured ovarian  cyst. In the setting of a positive pregnancy test, findings may represent a ruptured ovarian cyst. Recommend clinical correlation. 2. T-shaped IUD in appropriate position. Electronically Signed   By: Tish Frederickson M.D.   On: 05/24/2021 16:01     Assessment / Plan:   Beth Lucas is a 26 y.o. No obstetric history on file. who presents with acute onset pelvic pain . U/s c/w hemorrhagic CL cyst  Abdominal exam benign , but she has received several narcotic doses .    1. Repeat CBc , if significant decrease we will observe overnight If continues to bleed she may be candidate for l/s eval and evacuation . If hct stable she will follow up with me in 10 days [facility] .   D/W Dr Erma Heritage    Thank you for the opportunity to be involved with this pt's care.

## 2021-05-24 NOTE — ED Triage Notes (Signed)
C/O lower abdominal pain that radiates to lower back.  Also c/o 'bladder pressure".  States had some diarrhea overnight.  AAOx3.  Skin warm and dry. NAD

## 2021-06-26 ENCOUNTER — Other Ambulatory Visit: Payer: Self-pay

## 2021-06-26 ENCOUNTER — Ambulatory Visit
Admission: RE | Admit: 2021-06-26 | Discharge: 2021-06-26 | Disposition: A | Discharge status: Home / Self Care | Payer: Managed Care, Other (non HMO) | Source: Ambulatory Visit | Attending: Emergency Medicine | Admitting: Emergency Medicine

## 2021-06-26 VITALS — BP 122/71 | HR 76 | Temp 98.2°F | Resp 16

## 2021-06-26 DIAGNOSIS — R21 Rash and other nonspecific skin eruption: Secondary | ICD-10-CM

## 2021-06-26 MED ORDER — CEPHALEXIN 500 MG PO CAPS: 500.0000 mg | ORAL_CAPSULE | Freq: Four times a day (QID) | ORAL | 0 refills | Status: AC

## 2021-06-26 MED ORDER — PERMETHRIN 5 % EX CREA: TOPICAL_CREAM | CUTANEOUS | 0 refills | Status: AC

## 2021-06-26 MED ORDER — MUPIROCIN CALCIUM 2 % EX CREA: 1.0000 "application " | TOPICAL_CREAM | Freq: Two times a day (BID) | CUTANEOUS | 0 refills | Status: AC

## 2021-06-26 NOTE — Discharge Instructions (Signed)
If you develop redness of the breast, increased swelling or purulent discharge from the right nipple, please call this number to schedule appointment for a right breast ultrasound. [phone].  Select option 1 then second option. Please take Keflex, 2 tablets in the morning and 2 tablets at night. You can apply mupirocin to the bilateral areolas twice daily for the next 7 days. You have been prescribed permethrin cream for scabies since your boyfriend currently has it.  Please do not put permethrin on nipples.

## 2021-06-26 NOTE — ED Provider Notes (Signed)
[facility]  ____________________________________________  Time seen: Approximately 12:54 PM  I have reviewed the triage vital signs and the nursing notes.   HISTORY  Chief Complaint Rash   Historian Patient     HPI Beth Lucas is a 25 y.o. female presents to the emergency department with multiple medical concerns.  Patient had a 10 in June of this year and for the past 2 to 3 weeks has noticed a scaling, pruritic rash along bilateral areolas.  She has had some serous tenuous exudate she has noticed after scratching but no purulent discharge from the nipple.  She has not noticed any erythema of the skin but does state that subjectively, her right breast seems worse than her left.  She does report that her boyfriend was recently diagnosed with scabies although his rash does look different than the rash that she has on her breast.  She has no rash between her fingers or a intertriginous folds along her body.  She does state that her skin does seem more pruritic than usual.  No fever or chills at home.   History reviewed. No pertinent past medical history.   Immunizations up to date:  Yes.     History reviewed. No pertinent past medical history.  There are no problems to display for this patient.   Past Surgical History:  Procedure Laterality Date   BREAST REDUCTION SURGERY Bilateral     Prior to Admission medications   Medication Sig Start Date End Date Taking? Authorizing Provider  cephALEXin (KEFLEX) 500 MG capsule Take 1 capsule (500 mg total) by mouth 4 (four) times daily for 7 days. 06/26/21 07/03/21 Yes Pia Mau M, PA-C  escitalopram (LEXAPRO) 10 MG tablet Take 10 mg by mouth daily.   Yes [provider]  lisdexamfetamine (VYVANSE) 20 MG capsule Take 20 mg by mouth daily.   Yes [provider]  mupirocin cream (BACTROBAN) 2 % Apply 1 application topically 2 (two) times daily. 06/26/21  Yes Pia Mau M, PA-C  permethrin (ELIMITE)  5 % cream Apply to affected area once 06/26/21  Yes Joseph Art, Lockie Pares M, PA-C  ondansetron (ZOFRAN ODT) 4 MG disintegrating tablet Take 1 tablet (4 mg total) by mouth every 8 (eight) hours as needed for nausea or vomiting. 05/24/21   Shaune Pollack, MD  oxyCODONE (ROXICODONE) 5 MG immediate release tablet Take 1-2 tablets (5-10 mg total) by mouth every 6 (six) hours as needed for moderate pain or severe pain. 05/24/21 05/24/22  Shaune Pollack, MD    Allergies Patient has no known allergies.  No family history on file.  Social History Social History   Tobacco Use   Smoking status: Never   Smokeless tobacco: Never  Vaping Use   Vaping Use: Never used  Substance Use Topics   Alcohol use: Yes   Drug use: Yes    Types: Marijuana     Review of Systems  Constitutional: No fever/chills Eyes:  No discharge ENT: No upper respiratory complaints. Respiratory: no cough. No SOB/ use of accessory muscles to breath Gastrointestinal:   No nausea, no vomiting.  No diarrhea.  No constipation. Musculoskeletal: Negative for musculoskeletal pain. Skin: Patient has rash.     ____________________________________________   PHYSICAL EXAM:  VITAL SIGNS: ED Triage Vitals [06/26/21 1239]  Enc Vitals Group     BP      Pulse      Resp      Temp      Temp src  SpO2      Weight      Height      Head Circumference      Peak Flow      Pain Score 0     Pain Loc      Pain Edu?      Excl. in GC?      Constitutional: Alert and oriented. Well appearing and in no acute distress. Eyes: Conjunctivae are normal. PERRL. EOMI. Head: Atraumatic. ENT:      Nose: No congestion/rhinnorhea.      Mouth/Throat: Mucous membranes are moist.  Neck: No stridor.  No cervical spine tenderness to palpation. Cardiovascular: Normal rate, regular rhythm. Normal S1 and S2.  Good peripheral circulation. Respiratory: Normal respiratory effort without tachypnea or retractions. Lungs CTAB. Good air entry to the  bases with no decreased or absent breath sounds Gastrointestinal: Bowel sounds x 4 quadrants. Soft and nontender to palpation. No guarding or rigidity. No distention. Musculoskeletal: Full range of motion to all extremities. No obvious deformities noted Neurologic:  Normal for age. No gross focal neurologic deficits are appreciated.  Skin: Patient has scaling, erythematous rash with crusting along bilateral area was.  No burrow formation.  No similar rash along upper extremities, lower extremities or trunk.Marland Kitchen Psychiatric: Mood and affect are normal for age. Speech and behavior are normal.   ____________________________________________   LABS (all labs ordered are listed, but only abnormal results are displayed)  Labs Reviewed - No data to display ____________________________________________  EKG   ____________________________________________  RADIOLOGY   No results found.  ____________________________________________    PROCEDURES  Procedure(s) performed:     Procedures     Medications - No data to display   ____________________________________________   INITIAL IMPRESSION / ASSESSMENT AND PLAN / ED COURSE  Pertinent labs & imaging results that were available during my care of the patient were reviewed by me and considered in my medical decision making (see chart for details).      Assessment and plan Rash 25 year old female presents to the emergency department primarily concerned with right breast discomfort and rash.  Patient was alert, active and nontoxic-appearing on physical exam, patient had no purulent discharge from the right nipple.  She had no frank erythema of either breast and rash appeared bilateral in nature and primarily was localized along periareolar incision from breast reduction.  Highly suspicious for dermatitis secondary to breast reduction.  Rash does not resemble scabies and patient does not have rash in any other intertriginous fold on  her body.  Patient did request prescription for permethrin since her boyfriend was recently diagnosed I did prescribe it to her but advised against applying it to the nipple/areola.  I did reach out to the breast center in [place] and explained that my suspicion for a breast abscess was low today if patient experiences redness or purulent discharge from the right nipple, she should schedule an appointment for a right breast ultrasound.  She voiced understanding.  I did prescribe patient Keflex 4 times daily for the next 7 days and topical mupirocin as a suspect that patient has a secondary staph infection of area was secondary to excoriation.  Return precautions were given to return with new or worsening symptoms.  All patient questions were answered.     ____________________________________________  FINAL CLINICAL IMPRESSION(S) / ED DIAGNOSES  Final diagnoses:  Rash and nonspecific skin eruption      NEW MEDICATIONS STARTED DURING THIS VISIT:  ED Discharge Orders  Ordered    US BREAST ASPIRATION RIGHT        06/26/21 1227    permethrin (ELIMITE) 5 % cream        06/26/21 1247    cephALEXin (KEFLEX) 500 MG capsule  4 times daily        06/26/21 1248    mupirocin cream (BACTROBAN) 2 %  2 times daily        06/26/21 1248                This chart was dictated using voice recognition software/Dragon. Despite best efforts to proofread, errors can occur which can change the meaning. Any change was purely unintentional.     Orvil Feil, PA-C 06/26/21 1300

## 2021-06-26 NOTE — ED Triage Notes (Signed)
Rash around right breast for over a week. Pt had a breast reduction over the summer concerned for infection.

## 2021-07-19 ENCOUNTER — Ambulatory Visit: Payer: Self-pay

## 2022-09-09 IMAGING — US US PELVIS COMPLETE TRANSABD/TRANSVAG W DUPLEX
1 series · 13 of 25 positions shown · non-contrast
Comparison: Abdomen pelvis 05/24/2021

CLINICAL DATA: Adnexal pain.  Hemoperitoneum on CT

EXAM:
TRANSABDOMINAL AND TRANSVAGINAL ULTRASOUND OF PELVIS
DOPPLER ULTRASOUND OF OVARIES
TECHNIQUE: Both transabdominal and transvaginal ultrasound examinations of the
pelvis were performed. Transabdominal technique was performed for
global imaging of the pelvis including uterus, ovaries, adnexal
regions, and pelvic cul-de-sac.
It was necessary to proceed with endovaginal exam following the
transabdominal exam to visualize the bilateral ovaries and adnexa.
Color and duplex Doppler ultrasound was utilized to evaluate blood
flow to the ovaries.

[Series 1: us pelvic complete w transvaginal and torsion righ · 69 acquisitions, 13 frames shown]
[im 1/69]
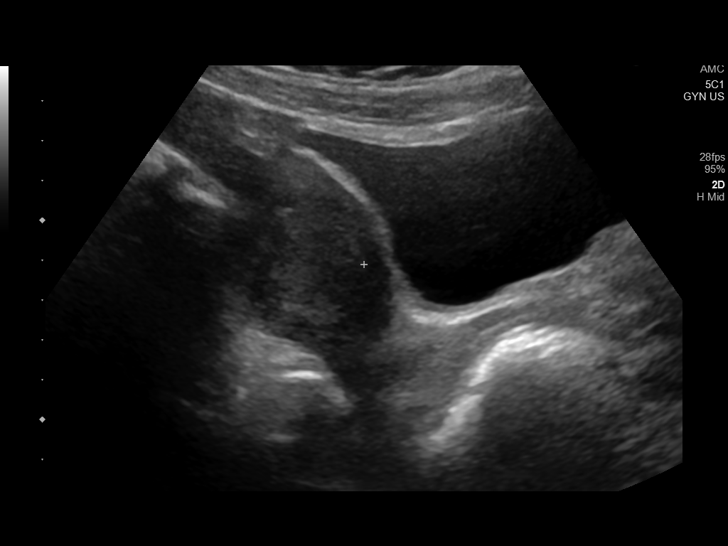
[im 6/69]
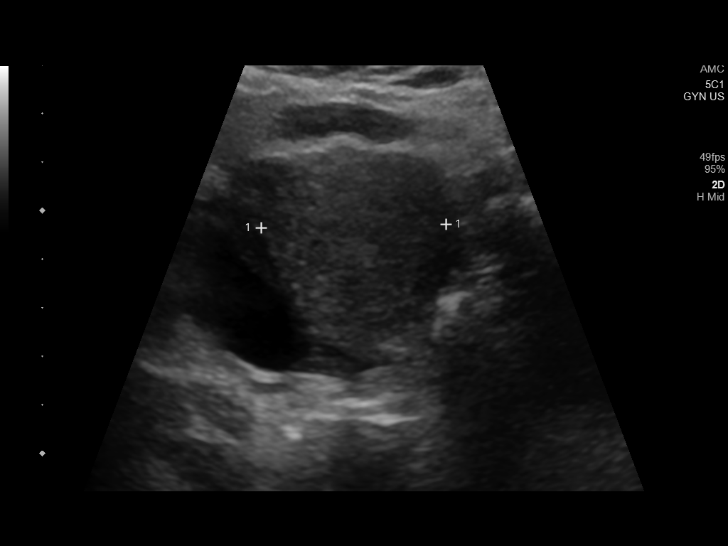
[im 12/69]
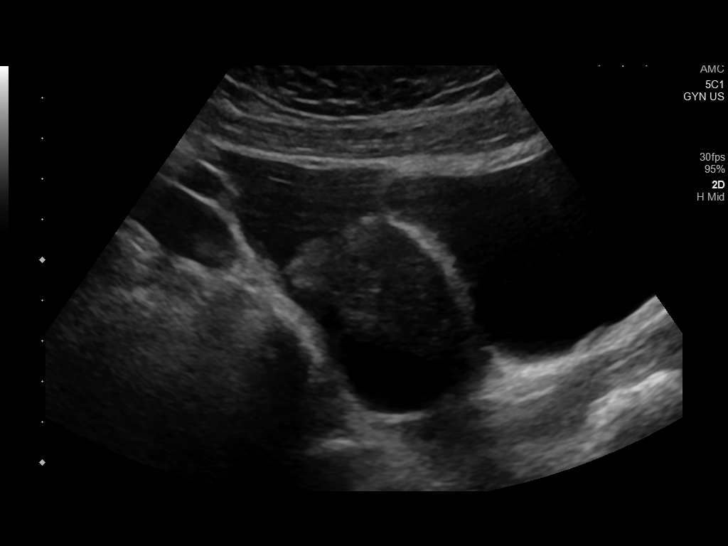
[im 18/69]
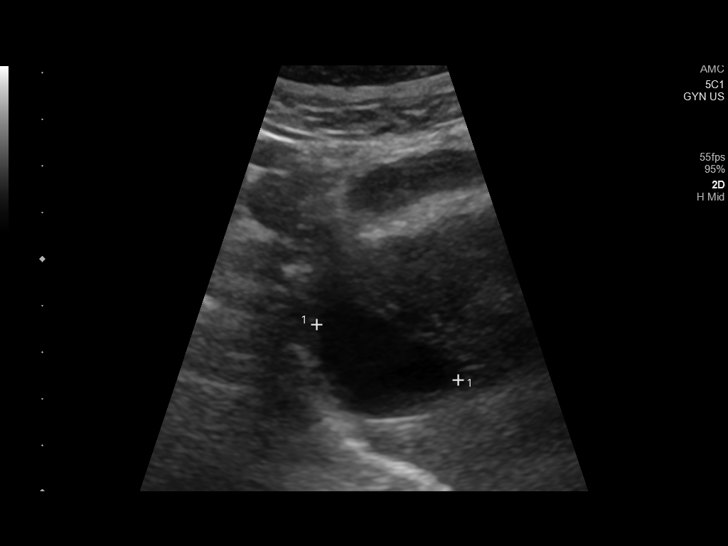
[im 23/69]
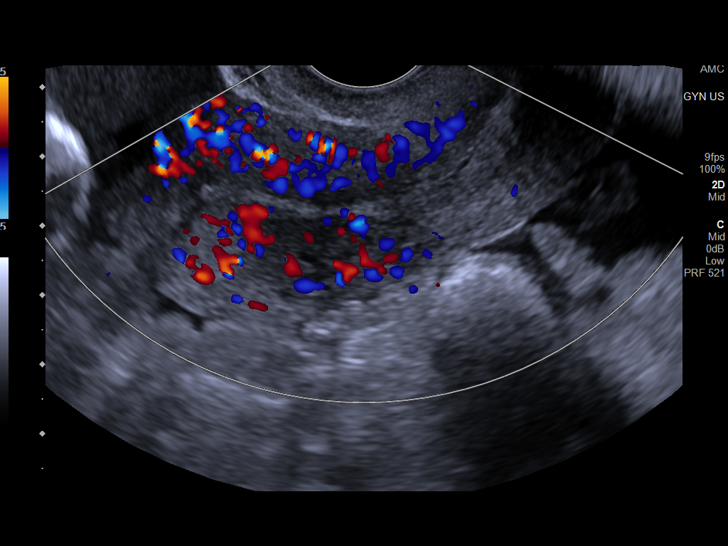
[im 29/69]
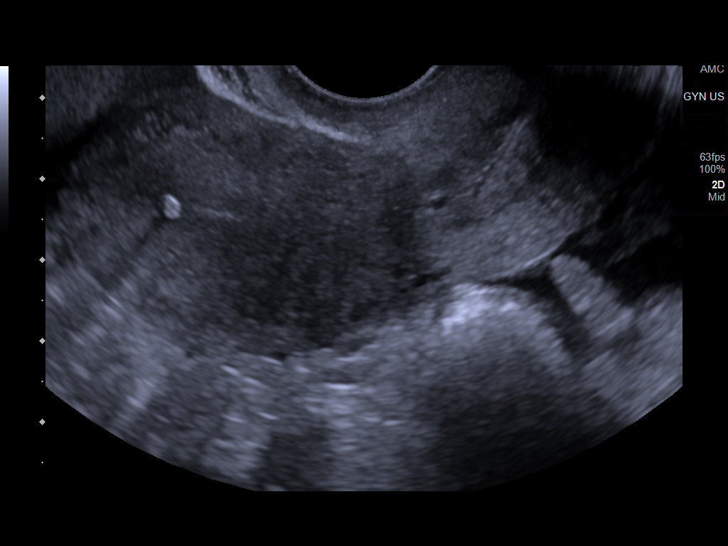
[im 35/69]
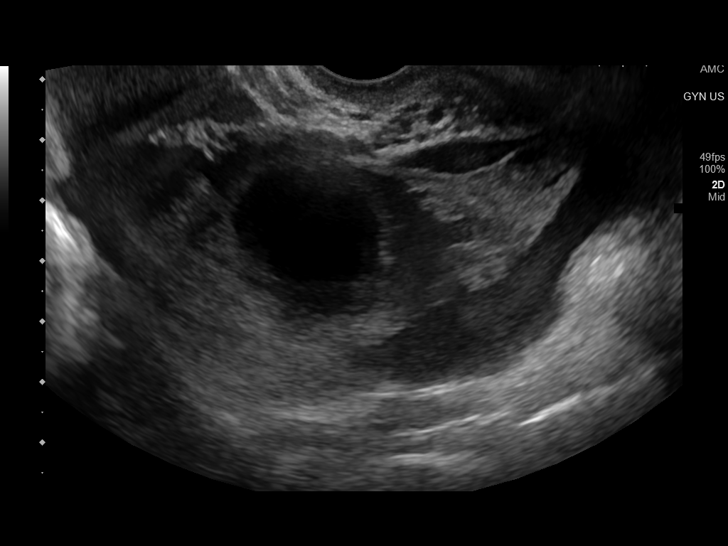
[im 40/69]
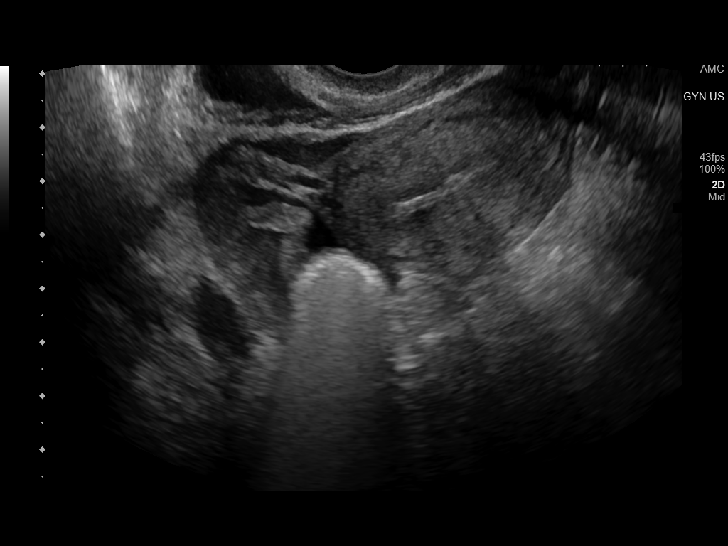
[im 46/69]
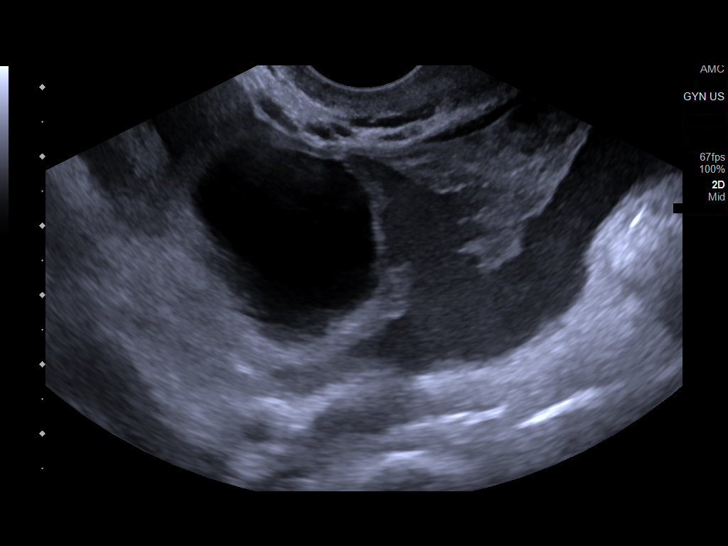
[im 52/69]
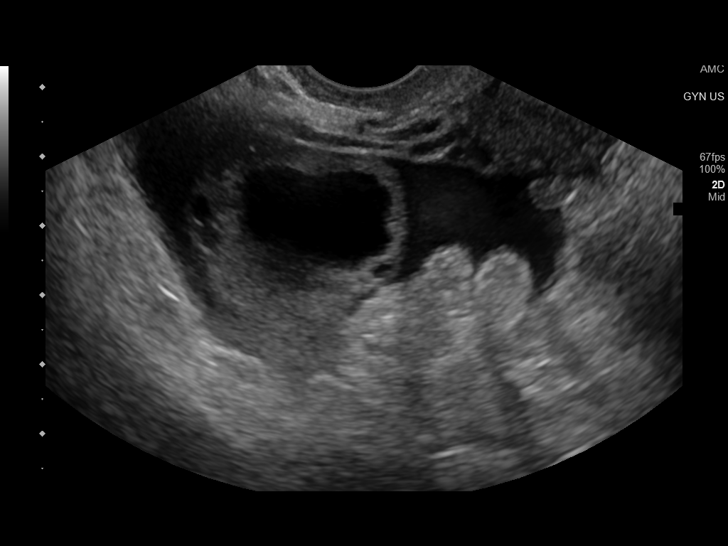
[im 57/69]
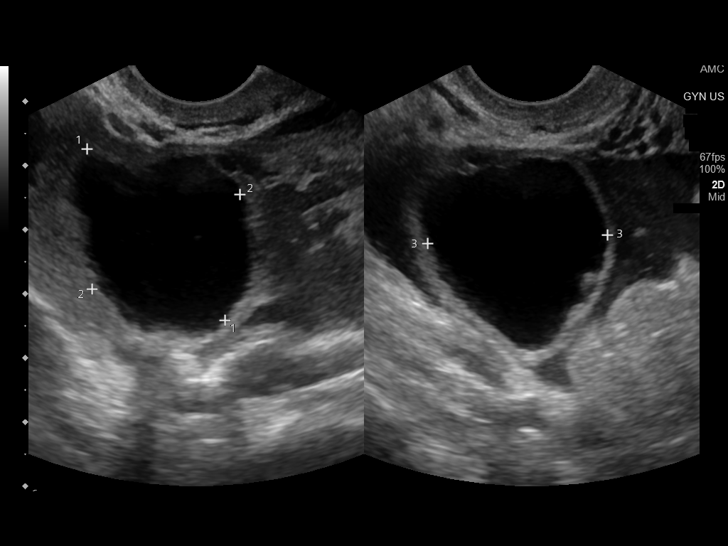
[im 63/69]
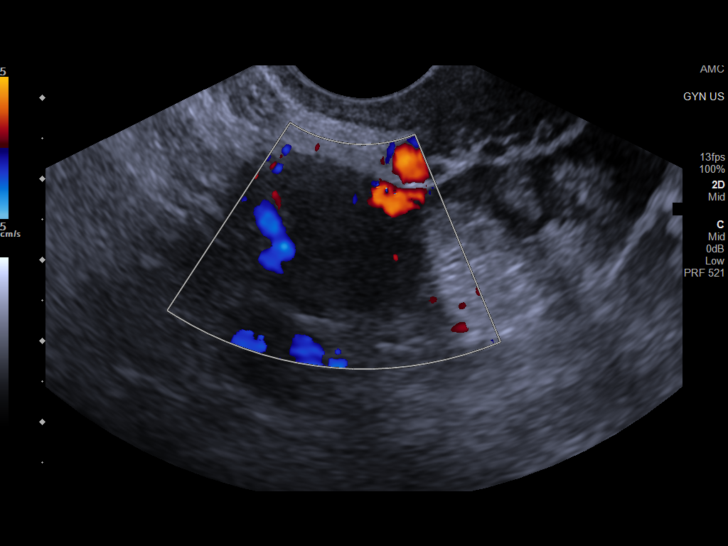
[im 69/69]
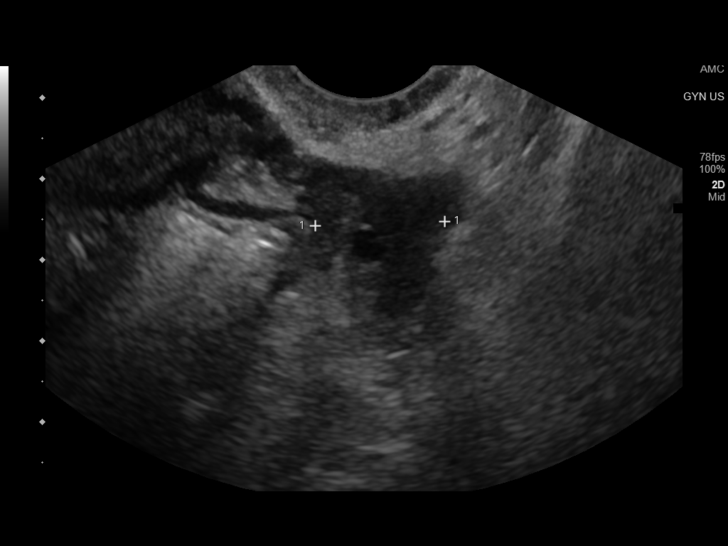

[13 of 25 positions shown; findings below may reference images not displayed]

FINDINGS: Uterus

Measurements: 7.7 x 3.1 x 4.3 cm = volume: 53 mL. No fibroids or
other mass visualized.

Endometrium

Thickness: 2 mm. No focal abnormality visualized. T-shaped IUD
appears within the endometrial canal in appropriate position.

Right ovary

Measurements: 4.3 x 3.5 x 3.2 cm = volume: 2.6 mL. There is a 3.4 cm
simple right ovarian cyst. Echogenic lesion along the right ovary in
the right adnexa likely represents a blood clot. Normal
appearance/no adnexal mass.

Left ovary

Measurements: 2.3 x 1.8 x 1.6 cm = volume: 3.3 mL. Normal
appearance/no adnexal mass.

Pulsed Doppler evaluation of both ovaries demonstrates normal
low-resistance arterial and venous waveforms.

Other findings

Moderate volume complex free fluid within the pelvis.
IMPRESSION: 1. Moderate volume complex free fluid within the pelvis consistent
with known hemoperitoneum. Echogenic lesion along the right ovary in
the right adnexa likely represents blood products. In the setting of
a negative pregnancy test, findings suggestive of a ruptured ovarian
cyst. In the setting of a positive pregnancy test, findings may
represent a ruptured ovarian cyst. Recommend clinical correlation.
2. T-shaped IUD in appropriate position.

## 2022-09-09 IMAGING — CT CT ABD-PELV W/ CM
2 of 4 series · 16 of 46 positions shown, 18 images · IV contrast (APPLIED)
Comparison: None.

CLINICAL DATA: Right lower quadrant abdominal pain.

EXAM:
CT ABDOMEN AND PELVIS WITH CONTRAST
TECHNIQUE: Multidetector CT imaging of the abdomen and pelvis was performed
using the standard protocol following bolus administration of
intravenous contrast.
CONTRAST:  80mL OMNIPAQUE IOHEXOL 350 MG/ML SOLN

[Series 2: routine abd/pel with · axial · 0.75mm/px · z∈[-1002,-572]mm · 13 of 94 slices shown, 15 images]
[im 4/94  soft-tissue]
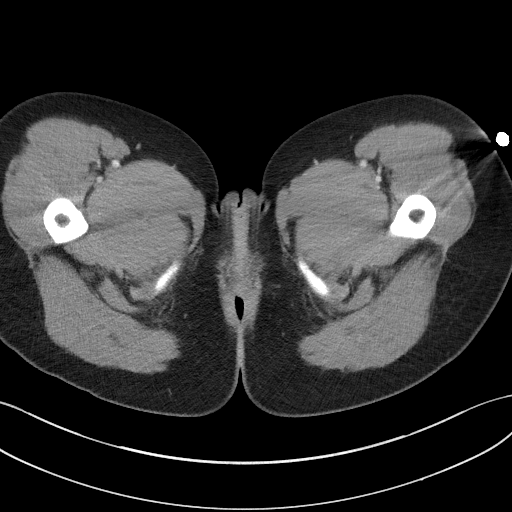
[im 4/94  bone]
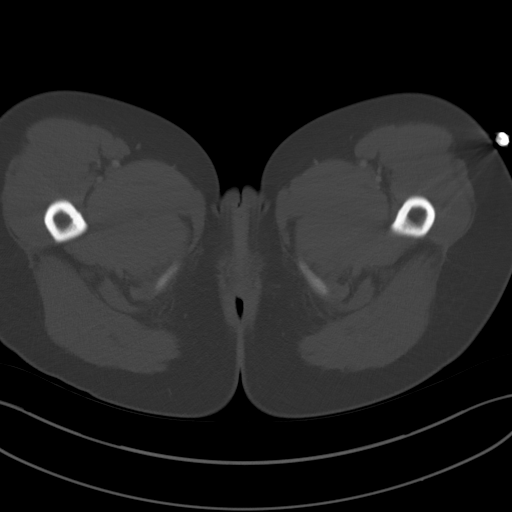
[im 12/94  soft-tissue]
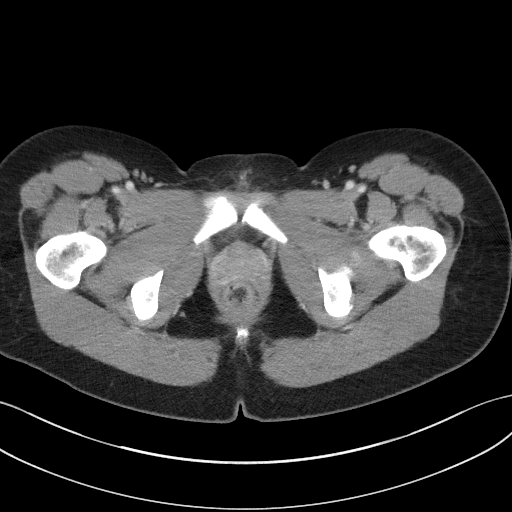
[im 19/94  soft-tissue]
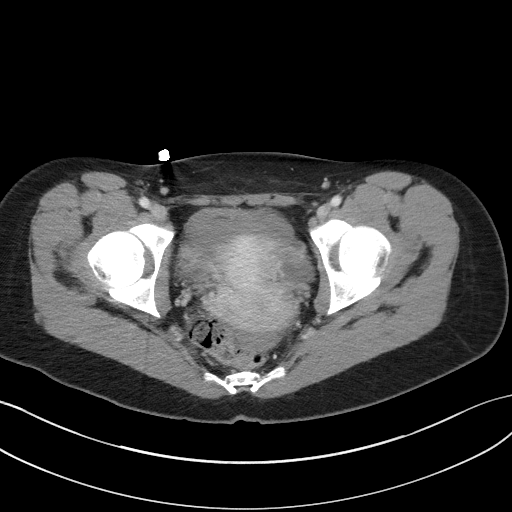
[im 27/94  soft-tissue]
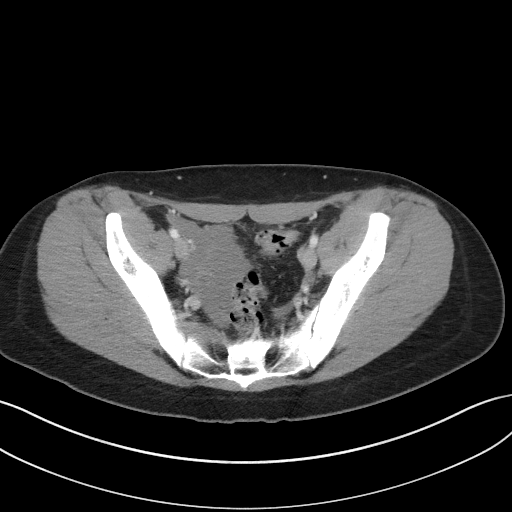
[im 34/94  soft-tissue]
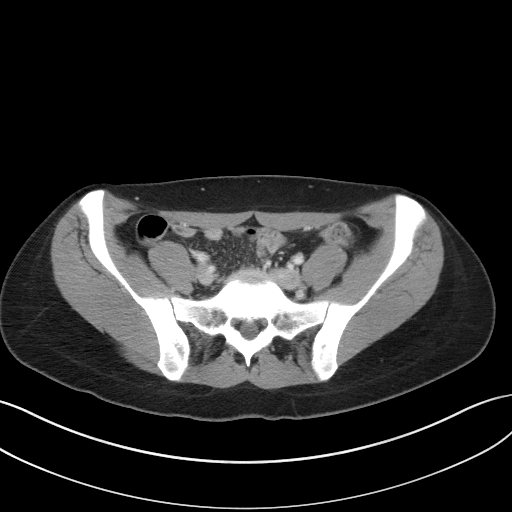
[im 41/94  soft-tissue]
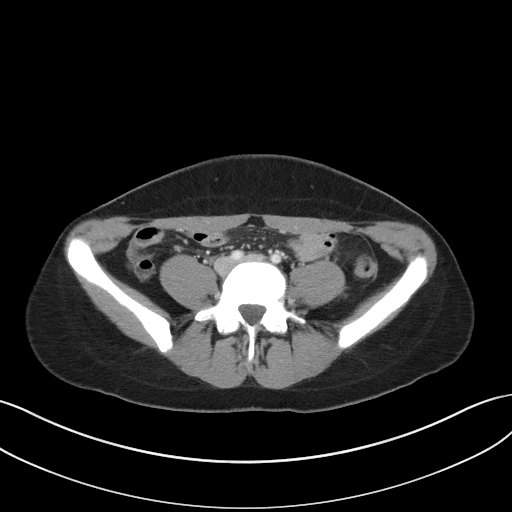
[im 49/94  soft-tissue]
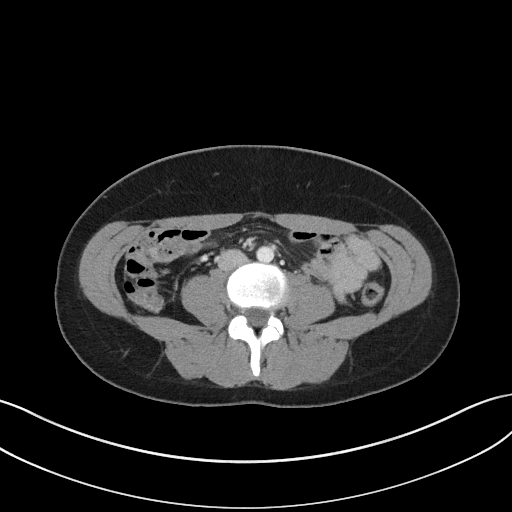
[im 53/94  soft-tissue]
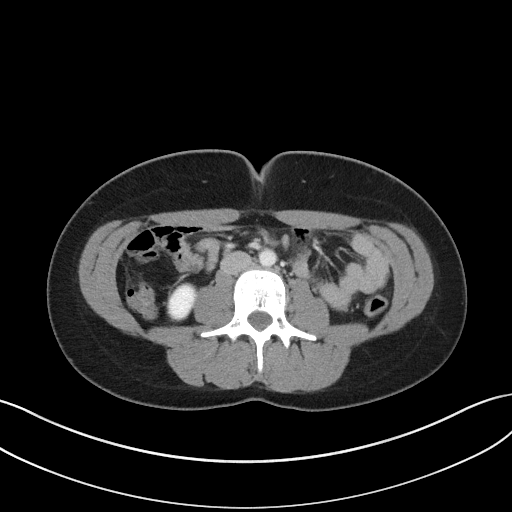
[im 60/94  soft-tissue]
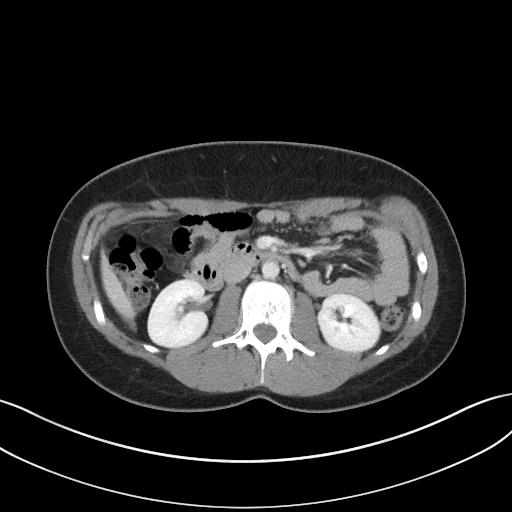
[im 60/94  bone]
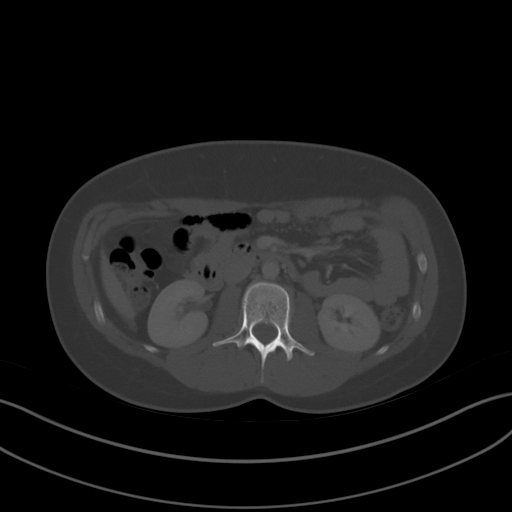
[im 67/94  soft-tissue]
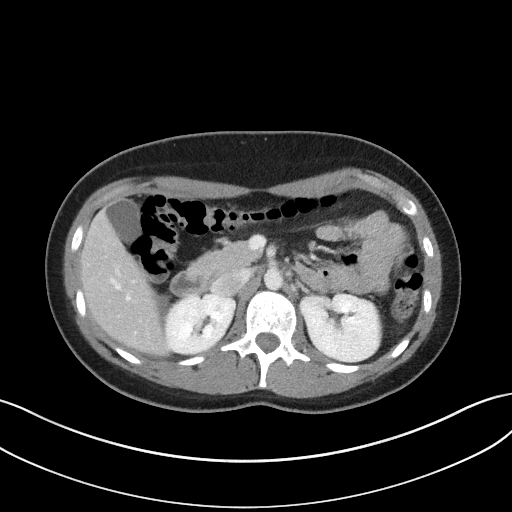
[im 75/94  soft-tissue]
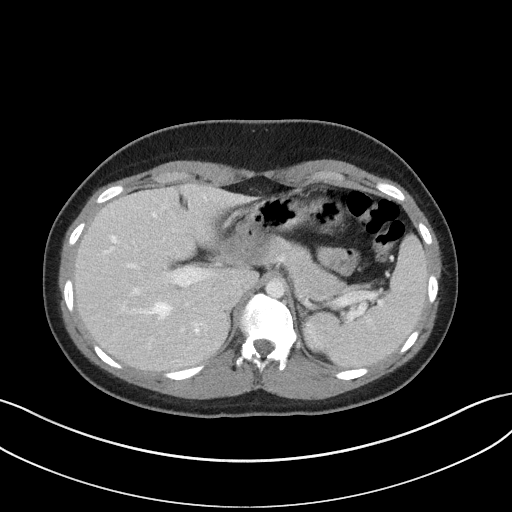
[im 82/94  soft-tissue]
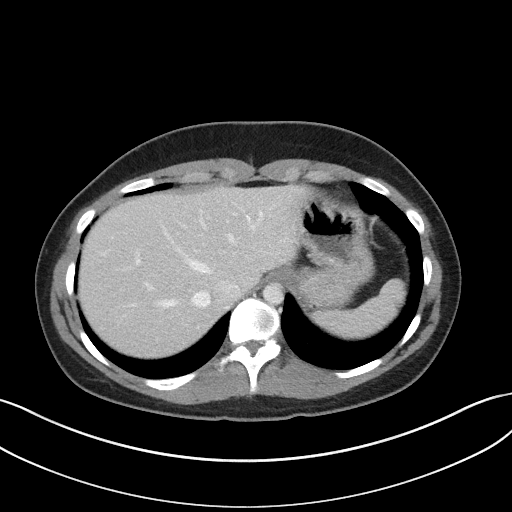
[im 90/94  soft-tissue]
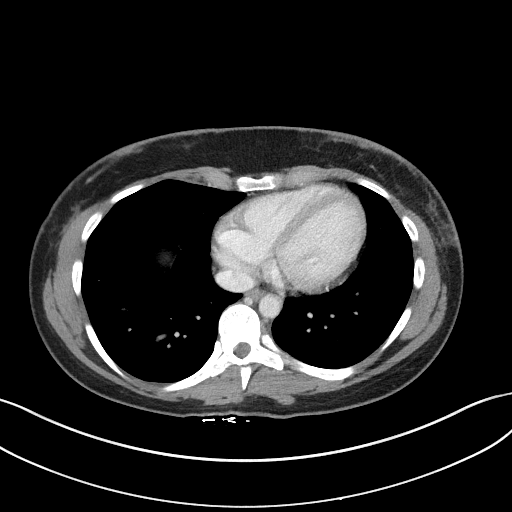

[Series 5: coronal st · coronal · 0.75mm/px · 3 of 88 slices shown]
[im 30/88  soft-tissue]
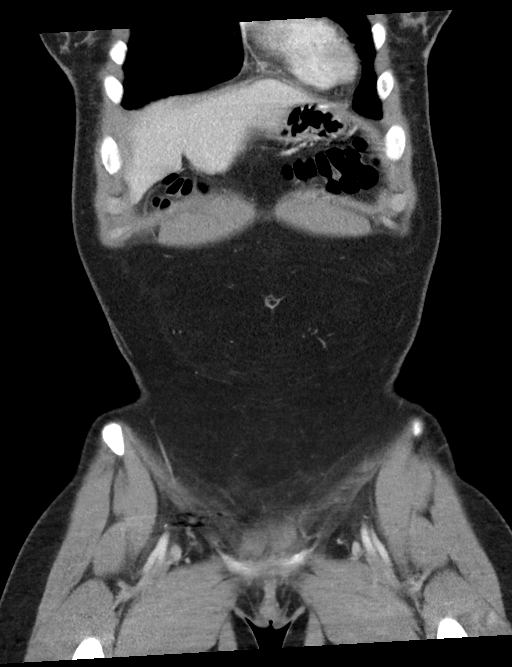
[im 39/88  soft-tissue]
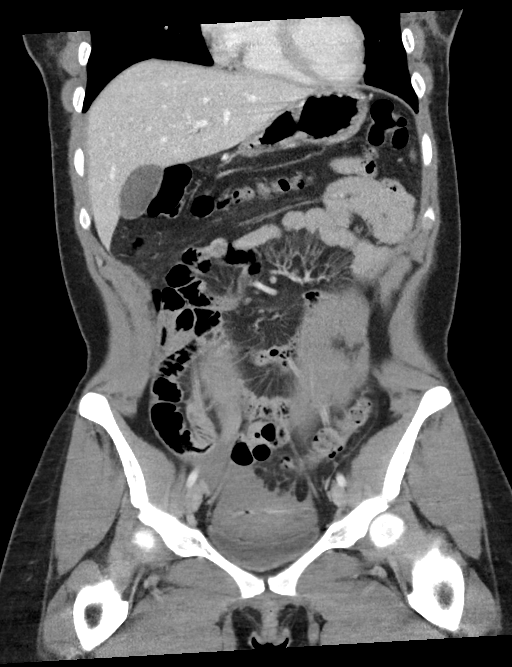
[im 49/88  soft-tissue]
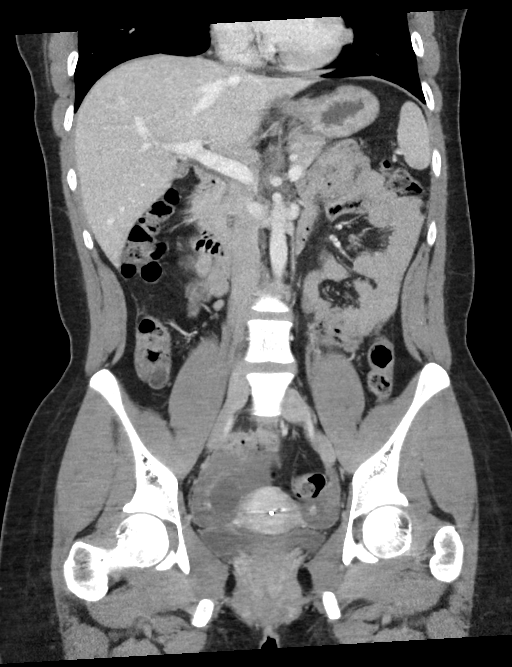

[16 of 46 positions shown; findings below may reference images not displayed]

FINDINGS: Lower chest: No acute abnormality.

Hepatobiliary: No focal liver abnormality is seen. No gallstones,
gallbladder wall thickening, or biliary dilatation.

Pancreas: Unremarkable. No pancreatic ductal dilatation or
surrounding inflammatory changes.

Spleen: Normal in size without focal abnormality.

Adrenals/Urinary Tract: Adrenal glands are unremarkable. Kidneys are
normal, without renal calculi, focal lesion, or hydronephrosis.
Bladder is unremarkable.

Stomach/Bowel: Stomach is within normal limits. Appendix appears
normal. No evidence of bowel wall thickening, distention, or
inflammatory changes.

Vascular/Lymphatic: No significant vascular findings are present. No
enlarged abdominal or pelvic lymph nodes.

Reproductive: Intrauterine device is noted. 3.6 cm right ovarian
follicle is noted. There is a moderate amount of fluid seen in the
pelvis both in the cul-de-sac but predominantly in the right adnexal
region. There is some high density material within this fluid
concerning for hemorrhage, and this is concerning for ruptured
ovarian cyst.

Other: No hernia is noted.

Musculoskeletal: No acute or significant osseous findings.
IMPRESSION: Moderate amount of high density fluid is noted in the pelvis
included in the cul-de-sac, but predominantly in the right adnexal
region. This is concerning for hemorrhage related to ruptured
ovarian cyst. Pelvic ultrasound may be performed for further
evaluation. Critical Value/emergent results were called by telephone
at the time of interpretation on 05/24/2021 at [DATE] to provider
QUEENA JUMPER , who verbally acknowledged these results.
